# Patient Record
Sex: Female | Born: 1950 | Race: White | Hispanic: No | Marital: Married | Smoking: Current every day smoker
Health system: Southern US, Community
[De-identification: ages and names within clinical notes are randomized; demographics above are authoritative.]

## PROBLEM LIST (undated history)

## (undated) DIAGNOSIS — R609 Edema, unspecified: Secondary | ICD-10-CM

## (undated) DIAGNOSIS — I10 Essential (primary) hypertension: Secondary | ICD-10-CM

## (undated) DIAGNOSIS — R011 Cardiac murmur, unspecified: Secondary | ICD-10-CM

## (undated) DIAGNOSIS — E669 Obesity, unspecified: Secondary | ICD-10-CM

## (undated) DIAGNOSIS — E7800 Pure hypercholesterolemia, unspecified: Secondary | ICD-10-CM

## (undated) DIAGNOSIS — J449 Chronic obstructive pulmonary disease, unspecified: Secondary | ICD-10-CM

## (undated) DIAGNOSIS — K219 Gastro-esophageal reflux disease without esophagitis: Secondary | ICD-10-CM

## (undated) DIAGNOSIS — I6529 Occlusion and stenosis of unspecified carotid artery: Secondary | ICD-10-CM

## (undated) DIAGNOSIS — E569 Vitamin deficiency, unspecified: Secondary | ICD-10-CM

## (undated) DIAGNOSIS — E538 Deficiency of other specified B group vitamins: Secondary | ICD-10-CM

## (undated) DIAGNOSIS — G2581 Restless legs syndrome: Secondary | ICD-10-CM

## (undated) DIAGNOSIS — G4730 Sleep apnea, unspecified: Secondary | ICD-10-CM

## (undated) DIAGNOSIS — J309 Allergic rhinitis, unspecified: Secondary | ICD-10-CM

## (undated) DIAGNOSIS — I350 Nonrheumatic aortic (valve) stenosis: Secondary | ICD-10-CM

## (undated) DIAGNOSIS — J45909 Unspecified asthma, uncomplicated: Secondary | ICD-10-CM

## (undated) HISTORY — DX: Essential (primary) hypertension: I10

## (undated) HISTORY — DX: Obesity, unspecified: E66.9

## (undated) HISTORY — DX: Chronic obstructive pulmonary disease, unspecified: J44.9

## (undated) HISTORY — DX: Deficiency of other specified B group vitamins: E53.8

## (undated) HISTORY — DX: Edema, unspecified: R60.9

## (undated) HISTORY — DX: Cardiac murmur, unspecified: R01.1

## (undated) HISTORY — DX: Vitamin deficiency, unspecified: E56.9

## (undated) HISTORY — DX: Nonrheumatic aortic (valve) stenosis: I35.0

## (undated) HISTORY — DX: Allergic rhinitis, unspecified: J30.9

## (undated) HISTORY — DX: Restless legs syndrome: G25.81

## (undated) HISTORY — PX: TUBAL LIGATION: SHX77

## (undated) HISTORY — DX: Occlusion and stenosis of unspecified carotid artery: I65.29

---

## 1998-10-27 ENCOUNTER — Other Ambulatory Visit: Admission: RE | Admit: 1998-10-27 | Discharge: 1998-10-27 | Payer: Self-pay | Admitting: *Deleted

## 2000-11-03 ENCOUNTER — Other Ambulatory Visit: Admission: RE | Admit: 2000-11-03 | Discharge: 2000-11-03 | Payer: Self-pay | Admitting: *Deleted

## 2001-02-13 ENCOUNTER — Encounter (INDEPENDENT_AMBULATORY_CARE_PROVIDER_SITE_OTHER): Payer: Self-pay | Admitting: Specialist

## 2001-02-13 ENCOUNTER — Other Ambulatory Visit: Admission: RE | Admit: 2001-02-13 | Discharge: 2001-02-13 | Payer: Self-pay | Admitting: *Deleted

## 2005-06-18 ENCOUNTER — Ambulatory Visit (HOSPITAL_COMMUNITY): Admission: RE | Admit: 2005-06-18 | Discharge: 2005-06-18 | Payer: Self-pay | Admitting: *Deleted

## 2006-06-24 ENCOUNTER — Ambulatory Visit (HOSPITAL_COMMUNITY): Admission: RE | Admit: 2006-06-24 | Discharge: 2006-06-24 | Payer: Self-pay | Admitting: *Deleted

## 2006-07-02 ENCOUNTER — Encounter: Admission: RE | Admit: 2006-07-02 | Discharge: 2006-07-02 | Payer: Self-pay | Admitting: *Deleted

## 2006-08-08 ENCOUNTER — Emergency Department (HOSPITAL_COMMUNITY): Admission: EM | Admit: 2006-08-08 | Discharge: 2006-08-08 | Payer: Self-pay | Admitting: Emergency Medicine

## 2007-09-20 ENCOUNTER — Emergency Department (HOSPITAL_COMMUNITY): Admission: EM | Admit: 2007-09-20 | Discharge: 2007-09-20 | Payer: Self-pay | Admitting: Emergency Medicine

## 2007-11-10 ENCOUNTER — Encounter: Admission: RE | Admit: 2007-11-10 | Discharge: 2007-11-10 | Payer: Self-pay | Admitting: Family Medicine

## 2009-01-22 ENCOUNTER — Emergency Department (HOSPITAL_COMMUNITY): Admission: EM | Admit: 2009-01-22 | Discharge: 2009-01-22 | Payer: Self-pay | Admitting: Emergency Medicine

## 2010-06-13 ENCOUNTER — Other Ambulatory Visit: Admission: RE | Admit: 2010-06-13 | Discharge: 2010-06-13 | Payer: Self-pay | Admitting: Family Medicine

## 2010-11-11 ENCOUNTER — Encounter: Payer: Self-pay | Admitting: Family Medicine

## 2011-01-30 LAB — CBC
MCHC: 34.2 g/dL (ref 30.0–36.0)
MCV: 91.4 fL (ref 78.0–100.0)
Platelets: 134 10*3/uL — ABNORMAL LOW (ref 150–400)
RBC: 5.54 MIL/uL — ABNORMAL HIGH (ref 3.87–5.11)

## 2011-01-30 LAB — URINALYSIS, ROUTINE W REFLEX MICROSCOPIC
Glucose, UA: NEGATIVE mg/dL
Leukocytes, UA: NEGATIVE
Nitrite: NEGATIVE
Protein, ur: 30 mg/dL — AB
pH: 5.5 (ref 5.0–8.0)

## 2011-01-30 LAB — BASIC METABOLIC PANEL
BUN: 13 mg/dL (ref 6–23)
CO2: 26 mEq/L (ref 19–32)
Calcium: 9 mg/dL (ref 8.4–10.5)
Chloride: 109 mEq/L (ref 96–112)
Creatinine, Ser: 0.84 mg/dL (ref 0.4–1.2)
GFR calc Af Amer: >60 mL/min (ref >60)

## 2011-01-30 LAB — DIFFERENTIAL
Basophils Absolute: 0 10*3/uL (ref 0.0–0.1)
Basophils Relative: 0 % (ref 0–1)
Eosinophils Absolute: 0.1 10*3/uL (ref 0.0–0.7)
Lymphocytes Relative: 15 % (ref 12–46)
Monocytes Absolute: 0.5 10*3/uL (ref 0.1–1.0)
Neutrophils Relative %: 79 % — ABNORMAL HIGH (ref 43–77)

## 2011-01-30 LAB — URINE MICROSCOPIC-ADD ON

## 2011-06-06 ENCOUNTER — Other Ambulatory Visit: Payer: Self-pay | Admitting: Family Medicine

## 2011-07-10 ENCOUNTER — Other Ambulatory Visit: Payer: Self-pay

## 2012-10-13 ENCOUNTER — Emergency Department (HOSPITAL_COMMUNITY)
Admission: EM | Admit: 2012-10-13 | Discharge: 2012-10-13 | Disposition: A | Discharge status: Home / Self Care | Payer: 59 | Attending: Emergency Medicine | Admitting: Emergency Medicine

## 2012-10-13 ENCOUNTER — Encounter (HOSPITAL_COMMUNITY): Payer: Self-pay | Admitting: *Deleted

## 2012-10-13 ENCOUNTER — Emergency Department (HOSPITAL_COMMUNITY): Payer: 59

## 2012-10-13 DIAGNOSIS — S92901A Unspecified fracture of right foot, initial encounter for closed fracture: Secondary | ICD-10-CM

## 2012-10-13 DIAGNOSIS — J45909 Unspecified asthma, uncomplicated: Secondary | ICD-10-CM | POA: Insufficient documentation

## 2012-10-13 DIAGNOSIS — Y939 Activity, unspecified: Secondary | ICD-10-CM | POA: Insufficient documentation

## 2012-10-13 DIAGNOSIS — S92909A Unspecified fracture of unspecified foot, initial encounter for closed fracture: Secondary | ICD-10-CM | POA: Insufficient documentation

## 2012-10-13 DIAGNOSIS — R269 Unspecified abnormalities of gait and mobility: Secondary | ICD-10-CM | POA: Insufficient documentation

## 2012-10-13 DIAGNOSIS — Z79899 Other long term (current) drug therapy: Secondary | ICD-10-CM | POA: Insufficient documentation

## 2012-10-13 DIAGNOSIS — E7800 Pure hypercholesterolemia, unspecified: Secondary | ICD-10-CM | POA: Insufficient documentation

## 2012-10-13 DIAGNOSIS — X500XXA Overexertion from strenuous movement or load, initial encounter: Secondary | ICD-10-CM | POA: Insufficient documentation

## 2012-10-13 DIAGNOSIS — F17200 Nicotine dependence, unspecified, uncomplicated: Secondary | ICD-10-CM | POA: Insufficient documentation

## 2012-10-13 DIAGNOSIS — K219 Gastro-esophageal reflux disease without esophagitis: Secondary | ICD-10-CM | POA: Insufficient documentation

## 2012-10-13 DIAGNOSIS — Y929 Unspecified place or not applicable: Secondary | ICD-10-CM | POA: Insufficient documentation

## 2012-10-13 HISTORY — DX: Unspecified asthma, uncomplicated: J45.909

## 2012-10-13 HISTORY — DX: Pure hypercholesterolemia, unspecified: E78.00

## 2012-10-13 HISTORY — DX: Sleep apnea, unspecified: G47.30

## 2012-10-13 HISTORY — DX: Gastro-esophageal reflux disease without esophagitis: K21.9

## 2012-10-13 MED ORDER — HYDROCODONE-ACETAMINOPHEN 5-325 MG PO TABS: 2.0000 | ORAL_TABLET | ORAL | Status: DC | PRN

## 2012-10-13 NOTE — ED Notes (Signed)
Pt reports stepping off a curb approx 0900 this am. Sts she rolled her R ankle. C/o R foot paim specfically over 5th metatarsal. Saw PCP, office radiology closed, sent to ED for xray to r/o Fx.

## 2012-10-13 NOTE — ED Provider Notes (Signed)
Medical screening examination/treatment/procedure(s) were performed by non-physician practitioner and as supervising physician I was immediately available for consultation/collaboration.  Ethelda Chick, MD 10/13/12 1425

## 2012-10-13 NOTE — ED Provider Notes (Signed)
History     CSN: [id number]  Arrival date & time 10/13/12  1212   First MD Initiated Contact with Patient 10/13/12 1324      Chief Complaint  Patient presents with  . Foot Pain    (Consider location/radiation/quality/duration/timing/severity/associated sxs/prior treatment) HPI  61 year old female presents for evaluation of R foot and ankle injury.  Pt reports stepping off a curb this AM and rolled her R ankle.  She noticed sharp throbbing pain to her R foot, and having difficulty ambulating due to pain.  Pain is moderate in severity, radiates up to ankle, without numbness.  Denies significant ankle or knee pain.  Was seen at her doctor's office who gave her a post-op boot and sent pt to imaging center.  However imaging office is closed therefore pt comes to ER for xray. No hx of diabetes.  Does have hx of "leaking valve" and does take Lasix daily for her pedal edema.  Past Medical History  Diagnosis Date  . Asthma   . Hypercholesteremia   . GERD (gastroesophageal reflux disease)   . Sleep apnea     Past Surgical History  Procedure Date  . Tubal ligation     No family history on file.  History  Substance Use Topics  . Smoking status: Current Every Day Smoker  . Smokeless tobacco: Not on file  . Alcohol Use: No    OB History    Grav Para Term Preterm Abortions TAB SAB Ect Mult Living                  Review of Systems  Constitutional: Negative for fever.  Musculoskeletal: Positive for arthralgias and gait problem. Negative for joint swelling.  Skin: Negative for rash and wound.  Neurological: Negative for numbness.    Allergies  Shellfish allergy  Home Medications   Current Outpatient Rx  Name  Route  Sig  Dispense  Refill  . ALBUTEROL SULFATE HFA 108 (90 BASE) MCG/ACT IN AERS   Inhalation   Inhale 2 puffs into the lungs every 6 (six) hours as needed. wheezing         . BUDESONIDE-FORMOTEROL FUMARATE 160-4.5 MCG/ACT IN AERO   Inhalation   Inhale 2  puffs into the lungs 2 (two) times daily.         Marland Kitchen CETIRIZINE HCL 5 MG PO TABS   Oral   Take 5 mg by mouth daily.         Marland Kitchen VITAMIN D 2000 UNITS PO TABS   Oral   Take 2,000 Units by mouth daily.         Marland Kitchen VITAMIN B 12 PO   Oral   Take 1 tablet by mouth daily.         . FUROSEMIDE 20 MG PO TABS   Oral   Take 20-40 mg by mouth daily as needed. edema         . IBUPROFEN 200 MG PO TABS   Oral   Take 200 mg by mouth every 6 (six) hours as needed. pain         . OMEPRAZOLE 20 MG PO CPDR   Oral   Take 20 mg by mouth daily.         Marland Kitchen ROSUVASTATIN CALCIUM 20 MG PO TABS   Oral   Take 20 mg by mouth at bedtime.         Marland Kitchen VITAMIN E 400 UNITS PO CAPS   Oral   Take 400  Units by mouth daily.           BP 147/66  Pulse 78  Temp 98.4 F (36.9 C) (Oral)  Resp 16  SpO2 96%  Physical Exam  Nursing note and vitals reviewed. Constitutional: She is oriented to person, place, and time. She appears well-developed and well-nourished. No distress.       obese  HENT:  Head: Atraumatic.  Musculoskeletal: She exhibits edema (2+ pretibial pitting edema to BLE) and tenderness (R foot: tenderness to 5th metatarsal and dorsum of midfoot.  R ankle nontender on exam. pedal pulse palpable.  no ecchymosis).       Right knee: Normal.       Right ankle: She exhibits swelling. tenderness. Head of 5th metatarsal tenderness found. No lateral malleolus, no medial malleolus, no posterior TFL and no proximal fibula tenderness found.  Neurological: She is alert and oriented to person, place, and time.  Skin: Skin is warm. No rash noted.    ED Course  Procedures (including critical care time)  Dg Foot Complete Right  10/13/2012  *RADIOLOGY REPORT*  Clinical Data: Pain post trauma  RIGHT FOOT COMPLETE - 3+ VIEW  Comparison: None.  Findings: Frontal, oblique, and lateral views were obtained.  There is an obliquely oriented fracture through the proximal diaphysis of the fifth metatarsal  in near anatomic alignment.  No other fractures are identified.  No dislocation.  There are spurs arising from the posterior inferior calcaneus. Joint spaces appear intact.  IMPRESSION: Fracture of the proximal diaphysis of the fifth metatarsal. Alignment near anatomic.  No other fractures.  No dislocation.   Original Report Authenticated By: Bretta Bang, M.D.      1. R foot fracture, Yetta Barre  MDM  R foot pain after rolling her R ankle stepping off a curb.  No ankle pain on exam.  Xray of R foot.  Pt has post-op boot.    2:22 PM Xray reviewed by me shows a fracture of the proximal diaphysis of the fifth metatarsal.  Alignment near anatomic.  No other fracture.  Since pt has post-op boot, i recommend usage of it and referral to orthopedic.  Pain medication prescribed.  RICE therapy discussed.  Crutches given for support as needed.    BP 147/66  Pulse 78  Temp 98.4 F (36.9 C) (Oral)  Resp 16  SpO2 96%  I have reviewed nursing notes and vital signs. I personally reviewed the imaging tests through PACS system  I reviewed available ER/hospitalization records thought the EMR      Fayrene Helper, [place] 10/13/12 1424

## 2013-07-18 ENCOUNTER — Encounter: Payer: Self-pay | Admitting: *Deleted

## 2013-07-18 ENCOUNTER — Encounter: Payer: Self-pay | Admitting: Cardiology

## 2013-07-18 DIAGNOSIS — J449 Chronic obstructive pulmonary disease, unspecified: Secondary | ICD-10-CM | POA: Insufficient documentation

## 2013-07-18 DIAGNOSIS — E7800 Pure hypercholesterolemia, unspecified: Secondary | ICD-10-CM | POA: Insufficient documentation

## 2013-07-18 DIAGNOSIS — E569 Vitamin deficiency, unspecified: Secondary | ICD-10-CM | POA: Insufficient documentation

## 2013-07-18 DIAGNOSIS — J309 Allergic rhinitis, unspecified: Secondary | ICD-10-CM | POA: Insufficient documentation

## 2013-07-18 DIAGNOSIS — I350 Nonrheumatic aortic (valve) stenosis: Secondary | ICD-10-CM | POA: Insufficient documentation

## 2013-07-18 DIAGNOSIS — E669 Obesity, unspecified: Secondary | ICD-10-CM | POA: Insufficient documentation

## 2013-07-18 DIAGNOSIS — J45909 Unspecified asthma, uncomplicated: Secondary | ICD-10-CM | POA: Insufficient documentation

## 2013-07-18 DIAGNOSIS — G2581 Restless legs syndrome: Secondary | ICD-10-CM | POA: Insufficient documentation

## 2013-07-18 DIAGNOSIS — K219 Gastro-esophageal reflux disease without esophagitis: Secondary | ICD-10-CM | POA: Insufficient documentation

## 2013-07-18 DIAGNOSIS — I10 Essential (primary) hypertension: Secondary | ICD-10-CM | POA: Insufficient documentation

## 2013-07-18 DIAGNOSIS — R609 Edema, unspecified: Secondary | ICD-10-CM | POA: Insufficient documentation

## 2013-07-18 DIAGNOSIS — G4730 Sleep apnea, unspecified: Secondary | ICD-10-CM | POA: Insufficient documentation

## 2013-07-18 DIAGNOSIS — E538 Deficiency of other specified B group vitamins: Secondary | ICD-10-CM | POA: Insufficient documentation

## 2013-07-22 ENCOUNTER — Encounter: Payer: Self-pay | Admitting: Cardiology

## 2013-07-26 ENCOUNTER — Ambulatory Visit (INDEPENDENT_AMBULATORY_CARE_PROVIDER_SITE_OTHER): Payer: 59 | Admitting: Cardiology

## 2013-07-26 ENCOUNTER — Encounter: Payer: Self-pay | Admitting: Cardiology

## 2013-07-26 VITALS — BP 160/68 | HR 99 | Ht 65.0 in | Wt 269.0 lb

## 2013-07-26 DIAGNOSIS — I359 Nonrheumatic aortic valve disorder, unspecified: Secondary | ICD-10-CM

## 2013-07-26 DIAGNOSIS — F17200 Nicotine dependence, unspecified, uncomplicated: Secondary | ICD-10-CM

## 2013-07-26 DIAGNOSIS — I10 Essential (primary) hypertension: Secondary | ICD-10-CM

## 2013-07-26 DIAGNOSIS — G4730 Sleep apnea, unspecified: Secondary | ICD-10-CM

## 2013-07-26 DIAGNOSIS — I350 Nonrheumatic aortic (valve) stenosis: Secondary | ICD-10-CM

## 2013-07-26 DIAGNOSIS — E669 Obesity, unspecified: Secondary | ICD-10-CM

## 2013-07-26 DIAGNOSIS — Z7189 Other specified counseling: Secondary | ICD-10-CM

## 2013-07-26 DIAGNOSIS — Z716 Tobacco abuse counseling: Secondary | ICD-10-CM | POA: Insufficient documentation

## 2013-07-26 NOTE — Patient Instructions (Signed)
Your physician recommends that you continue on your current medications as directed. Please refer to the Current Medication list given to you today.   Your physician recommends that you schedule a follow-up appointment in: 6 months with Dr. Mayford Knife.  Your physician has requested that you have an echocardiogram. Echocardiography is a painless test that uses sound waves to create images of your heart. It provides your doctor with information about the size and shape of your heart and how well your heart's chambers and valves are working. This procedure takes approximately one hour. There are no restrictions for this procedure.  We have contacted [facility] to contact you regarding a download on you Bipap machine.

## 2013-07-26 NOTE — Progress Notes (Signed)
[address] [place], [place]  [address] Phone: [phone] Fax:  [phone]  Date:  07/26/2013   ID:  Tanya King, DOB 1950/12/25, MRN [id number]  PCP:  No primary provider on file.  Cardiologist:  Armanda Magic, MD  Sleep Medicine:  Armanda Magic, MD   History of Present Illness: Tanya King is a 62 y.o. female with a history of Obstructive sleep apnea, HTN, mild AS and obesity who presents today for follow up.  She is doing well.  She tolerates her CPAP well.  She has no problems with her full face mask except for occasional leakiness.  She tolerates her pressure setting well.  She feels refreshed in the am and has no daytime sleepiness.  She may take a nap during the day if she goes to bed too late.  She has started to walk for exercise.  She denies any chest pain, dizziness or syncope.  She has had some DOE which she thinks is from not exercising a lot.  She has chronic LE edema which is worse if she has been sitting for long periods of time.   Wt Readings from Last 3 Encounters:  07/26/13 269 lb (122.018 kg)     Past Medical History  Diagnosis Date  . Asthma   . Hypercholesteremia   . GERD (gastroesophageal reflux disease)   . Sleep apnea     AHI 116/hr now on BiPAP at 25/58mmHg  . Edema   . HTN (hypertension)   . Allergic rhinitis   . Vitamin B12 deficiency   . RLS (restless legs syndrome)   . Vitamin deficiency   . COPD (chronic obstructive pulmonary disease)   . Obesity   . Aortic stenosis     <30% B/L ICA stenosis  . Edema     chronic LE edema  . Heart murmur     (mild AV stenosis with no regurg, mild MV sclerosis with mild MR, mod LVH 2012)  . Carotid artery occlusion     <30% bilateral ICA stenosis, no flow in left vertebral artery    Current Outpatient Prescriptions  Medication Sig Dispense Refill  . albuterol (PROVENTIL HFA;VENTOLIN HFA) 108 (90 BASE) MCG/ACT inhaler Inhale 2 puffs into the lungs every 6 (six) hours as needed. wheezing        . cetirizine (ZYRTEC) 5 MG tablet Take 5 mg by mouth daily.      . Cholecalciferol (VITAMIN D) 2000 UNITS tablet Take 2,000 Units by mouth daily.      . Cyanocobalamin (VITAMIN B 12 PO) Take 1 tablet by mouth daily.      . Fluticasone-Salmeterol (ADVAIR DISKUS IN) Inhale 12 g into the lungs 2 (two) times daily.      . furosemide (LASIX) 20 MG tablet Take 20-40 mg by mouth daily as needed. edema      . ibuprofen (ADVIL,MOTRIN) 200 MG tablet Take 200 mg by mouth every 6 (six) hours as needed. pain      . omeprazole (PRILOSEC) 20 MG capsule Take 20 mg by mouth daily.      . rosuvastatin (CRESTOR) 20 MG tablet Take 20 mg by mouth at bedtime.      . vitamin E 400 UNIT capsule Take 400 Units by mouth daily.       No current facility-administered medications for this visit.    Allergies:    Allergies  Allergen Reactions  . Lisinopril     Feels like she is burning up  .  Shellfish Allergy Rash    Social History:  The patient  reports that she has been smoking.  She does not have any smokeless tobacco history on file. She reports that she does not drink alcohol or use illicit drugs.   Family History:  The patient's family history is not on file.   ROS:  Please see the history of present illness.      All other systems reviewed and negative.   PHYSICAL EXAM: VS:  Ht 5\' 5"  (1.651 m)  Wt 269 lb (122.018 kg)  BMI 44.76 kg/m2 Well nourished, well developed, in no acute distress HEENT: normal Neck: no JVD Cardiac:  normal S1, S2; RRR; 2/6 SM at RUSB with radiation to Right carotid artery Lungs:  clear to auscultation bilaterally, no wheezing, rhonchi or rales Abd: soft, nontender, no hepatomegaly Ext: chronic venous stasis changes with trace edema Skin: warm and dry Neuro:  CNs 2-12 intact, no focal abnormalities noted     ASSESSMENT AND PLAN:  1. Mild Aortic stenosis  - I will check an echo to reassess AS 2.  Obstructive sleep apnea tolerating CPAP  - I will get a CPAP d/l from her  DME 3.  HTN - her BP is mildly elevated today - she had [place] food last PM  - I have asked her to check her BP daily for a week and call with the results 4.  Obesity  - I have encouraged her to increase her exercise and watch her portions on her diet 5.  LE edema  - continue to take furosemide as needed for LE edem 6.  Tobacco abuse  - she has purchased e cigarettes and I have encouraged her to start them.  She does not want to try any medication cessation drugs.  Followup with me in 6 months  Signed, Armanda Magic, MD 07/26/2013 4:18 PM

## 2013-08-11 ENCOUNTER — Other Ambulatory Visit (HOSPITAL_COMMUNITY): Payer: 59

## 2013-08-11 ENCOUNTER — Ambulatory Visit (HOSPITAL_COMMUNITY): Payer: 59 | Attending: Cardiology

## 2013-08-11 DIAGNOSIS — I350 Nonrheumatic aortic (valve) stenosis: Secondary | ICD-10-CM

## 2013-08-11 DIAGNOSIS — J4489 Other specified chronic obstructive pulmonary disease: Secondary | ICD-10-CM | POA: Insufficient documentation

## 2013-08-11 DIAGNOSIS — I6529 Occlusion and stenosis of unspecified carotid artery: Secondary | ICD-10-CM | POA: Insufficient documentation

## 2013-08-11 DIAGNOSIS — G4730 Sleep apnea, unspecified: Secondary | ICD-10-CM | POA: Insufficient documentation

## 2013-08-11 DIAGNOSIS — I359 Nonrheumatic aortic valve disorder, unspecified: Secondary | ICD-10-CM

## 2013-08-11 DIAGNOSIS — J449 Chronic obstructive pulmonary disease, unspecified: Secondary | ICD-10-CM | POA: Insufficient documentation

## 2013-08-11 DIAGNOSIS — E669 Obesity, unspecified: Secondary | ICD-10-CM | POA: Insufficient documentation

## 2013-08-11 DIAGNOSIS — I10 Essential (primary) hypertension: Secondary | ICD-10-CM | POA: Insufficient documentation

## 2013-08-11 NOTE — Progress Notes (Signed)
Echocardiogram performed.  

## 2013-08-12 ENCOUNTER — Telehealth: Payer: Self-pay | Admitting: Cardiology

## 2013-08-12 NOTE — Telephone Encounter (Signed)
Spoke with pt to make aware of ECHO results.

## 2013-08-12 NOTE — Telephone Encounter (Signed)
follow up     Pt calling back.  Call her at home    Thanks!

## 2013-10-29 ENCOUNTER — Other Ambulatory Visit: Payer: Self-pay | Admitting: Family Medicine

## 2013-10-29 ENCOUNTER — Other Ambulatory Visit (HOSPITAL_COMMUNITY)
Admission: RE | Admit: 2013-10-29 | Discharge: 2013-10-29 | Disposition: A | Discharge status: Home / Self Care | Payer: 59 | Source: Ambulatory Visit | Attending: Family Medicine | Admitting: Family Medicine

## 2013-10-29 DIAGNOSIS — Z124 Encounter for screening for malignant neoplasm of cervix: Secondary | ICD-10-CM | POA: Insufficient documentation

## 2013-12-18 ENCOUNTER — Encounter: Payer: Self-pay | Admitting: *Deleted

## 2014-06-07 ENCOUNTER — Other Ambulatory Visit: Payer: Self-pay | Admitting: General Surgery

## 2014-06-07 DIAGNOSIS — G4733 Obstructive sleep apnea (adult) (pediatric): Secondary | ICD-10-CM

## 2014-09-07 ENCOUNTER — Other Ambulatory Visit: Payer: Self-pay | Admitting: Family Medicine

## 2014-09-07 ENCOUNTER — Other Ambulatory Visit (HOSPITAL_COMMUNITY): Payer: Self-pay | Admitting: Family Medicine

## 2014-09-07 DIAGNOSIS — I771 Stricture of artery: Secondary | ICD-10-CM

## 2014-09-08 ENCOUNTER — Other Ambulatory Visit (HOSPITAL_COMMUNITY): Payer: Self-pay | Admitting: Family Medicine

## 2014-09-08 ENCOUNTER — Ambulatory Visit (HOSPITAL_COMMUNITY)
Admission: RE | Admit: 2014-09-08 | Discharge: 2014-09-08 | Disposition: A | Discharge status: Home / Self Care | Payer: 59 | Source: Ambulatory Visit | Attending: Family Medicine | Admitting: Family Medicine

## 2014-09-08 DIAGNOSIS — I350 Nonrheumatic aortic (valve) stenosis: Secondary | ICD-10-CM

## 2014-09-08 DIAGNOSIS — I771 Stricture of artery: Secondary | ICD-10-CM

## 2014-09-08 DIAGNOSIS — E669 Obesity, unspecified: Secondary | ICD-10-CM

## 2014-09-08 DIAGNOSIS — I6523 Occlusion and stenosis of bilateral carotid arteries: Secondary | ICD-10-CM | POA: Insufficient documentation

## 2014-09-08 NOTE — Progress Notes (Addendum)
Bilateral carotid artery duplex completed:  1-39% ICA stenosis.  Right:  Vertebral artery flow is antegrade.  Left:  Vertebral artery flow is retrograde.  Left upper extremity arterial duplex completed:  No evidence of significant plaque or stenosis noted.  Abnormal Doppler waveforms noted throughout with lower blood pressures in the left arm.

## 2014-09-09 ENCOUNTER — Other Ambulatory Visit: Payer: Self-pay | Admitting: Family Medicine

## 2014-09-09 ENCOUNTER — Encounter: Payer: Self-pay | Admitting: Surgery

## 2014-09-09 ENCOUNTER — Ambulatory Visit
Admission: RE | Admit: 2014-09-09 | Discharge: 2014-09-09 | Disposition: A | Discharge status: Home / Self Care | Payer: 59 | Source: Ambulatory Visit | Attending: Family Medicine | Admitting: Family Medicine

## 2014-09-09 DIAGNOSIS — I771 Stricture of artery: Secondary | ICD-10-CM

## 2014-09-12 ENCOUNTER — Ambulatory Visit (INDEPENDENT_AMBULATORY_CARE_PROVIDER_SITE_OTHER): Payer: 59 | Admitting: Surgery

## 2014-09-12 ENCOUNTER — Encounter: Payer: Self-pay | Admitting: Surgery

## 2014-09-12 ENCOUNTER — Other Ambulatory Visit: Payer: Self-pay | Admitting: Family Medicine

## 2014-09-12 VITALS — BP 92/60 | HR 84 | Ht 65.0 in | Wt 276.8 lb

## 2014-09-12 DIAGNOSIS — R9389 Abnormal findings on diagnostic imaging of other specified body structures: Secondary | ICD-10-CM

## 2014-09-12 DIAGNOSIS — I771 Stricture of artery: Secondary | ICD-10-CM

## 2014-09-12 DIAGNOSIS — I708 Atherosclerosis of other arteries: Secondary | ICD-10-CM

## 2014-09-12 NOTE — Progress Notes (Signed)
[facility]   Referred by:  Lilian Coma, MD [address], [place] [address]  Reason for referral: subclavian artery stenosis  History of Present Illness  Tanya King is a 63 y.o. (11-04-1950) female who presents with chief complaint: subclavian artery stenosis found on workup after a discrepancy was found in the blood pressures between her arms. She reports her left arm blood pressure being 60 points lower than the right. She reports some numbness in her left middle finger that she feels is related to quilting. She denies any other numbness or weakness of her left extremity. She denies any pain in her left arm.  She denies any amaurosis fugax, hemiparesis, or expressive/receptive aphasia. She does not have a prior history of stroke.   She has hypertension managed on Azor. She has previously on a statin for hypercholesterolemia that was discontinued by her PCP a year ago. She takes a daily aspirin. She has no prior cardiac history. She is not diabetic. She is a current smoker. She has COPD and sleep apnea.     Past Medical History  Diagnosis Date  . Asthma   . Hypercholesteremia   . GERD (gastroesophageal reflux disease)   . Sleep apnea     AHI 116/hr now on BiPAP at 25/59mmHg  . Edema   . HTN (hypertension)   . Allergic rhinitis   . Vitamin B12 deficiency   . RLS (restless legs syndrome)   . Vitamin deficiency   . COPD (chronic obstructive pulmonary disease)   . Obesity   . Aortic stenosis   . Edema     chronic LE edema  . Heart murmur     (mild AV stenosis with no regurg, mild MV sclerosis with mild MR, mod LVH 2012)  . Carotid artery occlusion     <30% bilateral ICA stenosis, no flow in left vertebral artery    Past Surgical History  Procedure Laterality Date  . Tubal ligation      History   Social History  . Marital Status: Married    Spouse Name: N/A    Number of Children: N/A  . Years of Education:  N/A   Occupational History  . Not on file.   Social History Main Topics  . Smoking status: Current Every Day Smoker -- 1.00 packs/day for 42 years    Types: Cigarettes  . Smokeless tobacco: Not on file  . Alcohol Use: No  . Drug Use: No  . Sexual Activity: Not on file   Other Topics Concern  . Not on file   Social History Narrative    Family History  Problem Relation Age of Onset  . Parkinson's disease Father   . Lung cancer Brother   . Diabetes Brother   . Heart attack Brother   . Diabetes Mother   . Hyperlipidemia Mother     Current Outpatient Prescriptions on File Prior to Visit  Medication Sig Dispense Refill  . albuterol (PROVENTIL HFA;VENTOLIN HFA) 108 (90 BASE) MCG/ACT inhaler Inhale 2 puffs into the lungs every 6 (six) hours as needed. wheezing    . cetirizine (ZYRTEC) 5 MG tablet Take 5 mg by mouth daily.    . Cholecalciferol (VITAMIN D) 2000 UNITS tablet Take 2,000 Units by mouth daily.    . Cyanocobalamin (VITAMIN B 12 PO) Take 1 tablet by mouth daily.    . Fluticasone-Salmeterol (ADVAIR DISKUS IN) Inhale 12 g into the lungs 2 (two) times daily.    Marland Kitchen  furosemide (LASIX) 20 MG tablet Take 20-40 mg by mouth daily as needed. edema    . vitamin E 400 UNIT capsule Take 400 Units by mouth daily.    Marland Kitchen ibuprofen (ADVIL,MOTRIN) 200 MG tablet Take 200 mg by mouth every 6 (six) hours as needed. pain    . omeprazole (PRILOSEC) 20 MG capsule Take 20 mg by mouth daily.    . rosuvastatin (CRESTOR) 20 MG tablet Take 20 mg by mouth at bedtime.     No current facility-administered medications on file prior to visit.    Allergies  Allergen Reactions  . Lisinopril     Feels like she is burning up  . Shellfish Allergy Rash    REVIEW OF SYSTEMS:  (Positives checked otherwise negative)  CARDIOVASCULAR:  []  chest pain, []  chest pressure, []  palpitations, []  shortness of breath when laying flat, [x]  shortness of breath with exertion,  []  pain in feet when walking, []  pain in  feet when laying flat, []  history of blood clot in veins (DVT), []  history of phlebitis, [x]  swelling in legs, [x]  varicose veins  PULMONARY:  [x]  productive cough, [x]  asthma, [x]  wheezing  NEUROLOGIC:  []  weakness in arms or legs, []  numbness in arms or legs, []  difficulty speaking or slurred speech, []  temporary loss of vision in one eye, []  dizziness  HEMATOLOGIC:  []  bleeding problems, []  problems with blood clotting too easily  MUSCULOSKEL:  []  joint pain, []  joint swelling  GASTROINTEST:  []  vomiting blood, []  blood in stool     GENITOURINARY:  []  burning with urination, []  blood in urine  PSYCHIATRIC:  []  history of major depression  INTEGUMENTARY:  []  rashes, []  ulcers  CONSTITUTIONAL:  []  fever, []  chills  For VQI Use Only  PRE-ADM LIVING: Home  AMB STATUS: Ambulatory  CAD Sx: None  PRIOR CHF: None  STRESS TEST: [ x] No, [ ]  Normal, [ ]  + ischemia, [ ]  + MI, [ ]  Both   Physical Examination  Filed Vitals:   09/12/14 1452 09/12/14 1457  BP: 142/62 92/60  Pulse: 84   Height: 5\' 5"  (1.651 m)   Weight: 276 lb 12.8 oz (125.556 kg)   SpO2: 97%     Body mass index is 46.06 kg/(m^2).  General: A&O x 3, WD morbidly obese female in NAD  Head: [place]/AT  Eyes: PERRLA  Neck: Supple  Pulmonary: Sym exp, good air movt, CTAB, no rales, rhonchi, & wheezing   Cardiac: RRR, Nl S1, S2, systolic murmur at RSB, bruit over left pulmonic area  Vascular: Vessel Right Left  Radial Palpable Not Palpable  Brachial Palpable Not Palpable  Carotid Palpable, without bruit Palpable, without bruit  Aorta Not palpable N/A  PT Not Palpable Not Palpable  DP Not Palpable Not Palpable   Gastrointestinal: soft, obese, NTND, -G/R, - HSM, - masses  Musculoskeletal: M/S 5/5 throughout. Extremities without ischemic changes.   Neurologic: CN 2-12 intact. Pain and light touch intact in extremities. Motor exam as listed above  Psychiatric: Judgment intact, Mood & affect appropriate  for pt's clinical situation  Dermatologic: See M/S exam for extremity exam, no rashes otherwise noted  Non-Invasive Vascular Imaging  CAROTID DUPLEX (Date: 09/08/14):   R ICA stenosis: 1-39%  R VA:  patent and antegrade  L ICA stenosis: 1-39%  L VA:  Retrograde flow    UPPER EXTREMITY ARTERIAL DUPLEX (Date: 09/12/2014)  Right subclavian: biphasic; triphasic right brachial and radial  Left subclavian: monophasic   Left axillary: biphasic  Left brachial, radial and ulnar: monophasic  Outside Studies/Documentation 9 pages of outside documents were reviewed including: medical records from [facility].   Medical Decision Making  Winta Barcelo is a 63 y.o. female who presents with: asymptomatic left subclavian artery stenosis. Her carotid artery duplex revealed retrograde left vertebral flow and 1-39% internal carotid stenosis bilaterally. She denies any history of TIA or stroke symptoms and denies any left upper extremity weakness or pain. Given that she is asymptomatic from her left subclavian stenosis, there is no indication for surgical intervention.    Discussed in depth with the patient the importance of maximal medical management including strict control of blood pressure, lipid levels, antiplatelet agents, obtaining regular exercise, and cessation of smoking. Recommend restarting her on a cholesterol medication. She will follow up with her PCP regarding this.   The patient is aware that without maximal medical management the underlying atherosclerotic disease process will progress.   The patient will follow up in one year with carotid duplex studies.   She was advised to contact the office if she develops any ischemic symptoms of her left upper extremity.   Virgina Jock, PA-C [facility] Office: [phone] Pager: [phone]  09/12/2014, 4:11 PM   This patient was seen and examined in conjunction with Dr.  Trula Slade.   I agree with the above.  I have seen and examined the patient.  She has asymptomatic left subclavian steal syndrome.  This was first detected with asymmetric upper extremity blood pressure measurements.  She had a carotid Doppler study which showed no evidence of carotid stenosis, however she did have retrograde left vertebral artery blood flow.  This suggests proximal left subclavian artery stenosis versus occlusion.  The patient denies any neurologic symptoms.  She does not have weakness with activity in her left arm.  I discussed with the patient that as long as she remains asymptomatic, I would not recommend any intervention.  However, because she has developed a stenosis, I would treat her aggressively for peripheral vascular disease.  I discussed the following as important steps for her to do to improve her long-term outcome and minimize her risk for heart attack and stroke.  #1: She needs to continue to take a baby aspirin for antiplatelet therapy. #2: I would like for her to be placed back on a statin for hypercholesterolemia. #3: She needs to aggressively treat her blood pressure.  All medication changes should be based on readings from her right arm.   #4: I discussed extensively with her the importance of smoking cessation.  #5: I encouraged her to start an exercise program.  The patient will follow-up with me with a repeat carotid/subclavian ultrasound in one year  If she develops symptoms, she will contact me sooner than that.  Annamarie Major

## 2014-09-14 ENCOUNTER — Ambulatory Visit
Admission: RE | Admit: 2014-09-14 | Discharge: 2014-09-14 | Disposition: A | Discharge status: Home / Self Care | Payer: 59 | Source: Ambulatory Visit | Attending: Family Medicine | Admitting: Family Medicine

## 2014-09-14 DIAGNOSIS — R9389 Abnormal findings on diagnostic imaging of other specified body structures: Secondary | ICD-10-CM

## 2014-09-14 MED ORDER — IOHEXOL 300 MG/ML  SOLN
75.0000 mL | Freq: Once | INTRAMUSCULAR | Status: AC | PRN
  Administered 2014-09-14: 75 mL via INTRAVENOUS

## 2014-09-14 NOTE — Addendum Note (Signed)
Addended by: Dorthula Rue L on: 09/14/2014 12:53 PM   Modules accepted: Orders

## 2014-09-14 NOTE — Addendum Note (Signed)
Addended by: Dorthula Rue L on: 09/14/2014 09:06 AM   Modules accepted: Orders

## 2014-09-19 ENCOUNTER — Other Ambulatory Visit (HOSPITAL_COMMUNITY): Payer: Self-pay | Admitting: Surgery

## 2014-09-19 ENCOUNTER — Other Ambulatory Visit (HOSPITAL_COMMUNITY): Payer: Self-pay | Admitting: Family Medicine

## 2014-09-19 DIAGNOSIS — J9859 Other diseases of mediastinum, not elsewhere classified: Secondary | ICD-10-CM

## 2014-09-19 DIAGNOSIS — R918 Other nonspecific abnormal finding of lung field: Secondary | ICD-10-CM

## 2014-09-21 ENCOUNTER — Encounter (HOSPITAL_COMMUNITY): Payer: Self-pay

## 2014-09-21 ENCOUNTER — Ambulatory Visit (HOSPITAL_COMMUNITY)
Admission: RE | Admit: 2014-09-21 | Discharge: 2014-09-21 | Disposition: A | Discharge status: Home / Self Care | Payer: 59 | Source: Ambulatory Visit | Attending: Surgery | Admitting: Surgery

## 2014-09-21 DIAGNOSIS — J9859 Other diseases of mediastinum, not elsewhere classified: Secondary | ICD-10-CM

## 2014-09-21 DIAGNOSIS — R918 Other nonspecific abnormal finding of lung field: Secondary | ICD-10-CM | POA: Diagnosis not present

## 2014-09-21 LAB — GLUCOSE, CAPILLARY: GLUCOSE-CAPILLARY: 102 mg/dL — AB (ref 70–99)

## 2014-09-21 MED ORDER — FLUDEOXYGLUCOSE F - 18 (FDG) INJECTION
13.9000 | Freq: Once | INTRAVENOUS | Status: AC | PRN
  Administered 2014-09-21: 13.9 via INTRAVENOUS

## 2014-12-07 ENCOUNTER — Inpatient Hospital Stay (HOSPITAL_COMMUNITY)
Admission: EM | Admit: 2014-12-07 | Discharge: 2014-12-10 | DRG: 070 | Disposition: A | Discharge status: Home / Self Care | Payer: 59 | Attending: Family Medicine | Admitting: Family Medicine

## 2014-12-07 ENCOUNTER — Encounter (HOSPITAL_COMMUNITY): Payer: Self-pay | Admitting: *Deleted

## 2014-12-07 ENCOUNTER — Emergency Department (HOSPITAL_COMMUNITY): Payer: 59

## 2014-12-07 DIAGNOSIS — Z7982 Long term (current) use of aspirin: Secondary | ICD-10-CM

## 2014-12-07 DIAGNOSIS — I6523 Occlusion and stenosis of bilateral carotid arteries: Secondary | ICD-10-CM | POA: Diagnosis present

## 2014-12-07 DIAGNOSIS — B9620 Unspecified Escherichia coli [E. coli] as the cause of diseases classified elsewhere: Secondary | ICD-10-CM | POA: Diagnosis present

## 2014-12-07 DIAGNOSIS — Z91013 Allergy to seafood: Secondary | ICD-10-CM | POA: Diagnosis not present

## 2014-12-07 DIAGNOSIS — Z801 Family history of malignant neoplasm of trachea, bronchus and lung: Secondary | ICD-10-CM | POA: Diagnosis not present

## 2014-12-07 DIAGNOSIS — J45909 Unspecified asthma, uncomplicated: Secondary | ICD-10-CM | POA: Diagnosis present

## 2014-12-07 DIAGNOSIS — E785 Hyperlipidemia, unspecified: Secondary | ICD-10-CM | POA: Diagnosis present

## 2014-12-07 DIAGNOSIS — G2581 Restless legs syndrome: Secondary | ICD-10-CM | POA: Diagnosis present

## 2014-12-07 DIAGNOSIS — G4733 Obstructive sleep apnea (adult) (pediatric): Secondary | ICD-10-CM | POA: Diagnosis present

## 2014-12-07 DIAGNOSIS — R011 Cardiac murmur, unspecified: Secondary | ICD-10-CM | POA: Diagnosis present

## 2014-12-07 DIAGNOSIS — G936 Cerebral edema: Secondary | ICD-10-CM | POA: Diagnosis present

## 2014-12-07 DIAGNOSIS — I350 Nonrheumatic aortic (valve) stenosis: Secondary | ICD-10-CM | POA: Diagnosis present

## 2014-12-07 DIAGNOSIS — R262 Difficulty in walking, not elsewhere classified: Secondary | ICD-10-CM | POA: Diagnosis present

## 2014-12-07 DIAGNOSIS — Z8249 Family history of ischemic heart disease and other diseases of the circulatory system: Secondary | ICD-10-CM | POA: Diagnosis not present

## 2014-12-07 DIAGNOSIS — R599 Enlarged lymph nodes, unspecified: Secondary | ICD-10-CM | POA: Diagnosis present

## 2014-12-07 DIAGNOSIS — I10 Essential (primary) hypertension: Secondary | ICD-10-CM | POA: Diagnosis present

## 2014-12-07 DIAGNOSIS — E538 Deficiency of other specified B group vitamins: Secondary | ICD-10-CM | POA: Diagnosis present

## 2014-12-07 DIAGNOSIS — N390 Urinary tract infection, site not specified: Secondary | ICD-10-CM | POA: Diagnosis present

## 2014-12-07 DIAGNOSIS — R911 Solitary pulmonary nodule: Secondary | ICD-10-CM | POA: Diagnosis present

## 2014-12-07 DIAGNOSIS — K219 Gastro-esophageal reflux disease without esophagitis: Secondary | ICD-10-CM | POA: Diagnosis present

## 2014-12-07 DIAGNOSIS — E669 Obesity, unspecified: Secondary | ICD-10-CM | POA: Diagnosis present

## 2014-12-07 DIAGNOSIS — R4781 Slurred speech: Secondary | ICD-10-CM | POA: Diagnosis present

## 2014-12-07 DIAGNOSIS — Z6841 Body Mass Index (BMI) 40.0 and over, adult: Secondary | ICD-10-CM | POA: Diagnosis not present

## 2014-12-07 DIAGNOSIS — Z888 Allergy status to other drugs, medicaments and biological substances status: Secondary | ICD-10-CM

## 2014-12-07 DIAGNOSIS — G939 Disorder of brain, unspecified: Principal | ICD-10-CM | POA: Diagnosis present

## 2014-12-07 DIAGNOSIS — Z79899 Other long term (current) drug therapy: Secondary | ICD-10-CM | POA: Diagnosis not present

## 2014-12-07 DIAGNOSIS — Z833 Family history of diabetes mellitus: Secondary | ICD-10-CM | POA: Diagnosis not present

## 2014-12-07 DIAGNOSIS — E780 Pure hypercholesterolemia: Secondary | ICD-10-CM | POA: Diagnosis present

## 2014-12-07 DIAGNOSIS — J449 Chronic obstructive pulmonary disease, unspecified: Secondary | ICD-10-CM | POA: Diagnosis present

## 2014-12-07 DIAGNOSIS — R52 Pain, unspecified: Secondary | ICD-10-CM

## 2014-12-07 DIAGNOSIS — G8191 Hemiplegia, unspecified affecting right dominant side: Secondary | ICD-10-CM | POA: Diagnosis present

## 2014-12-07 DIAGNOSIS — R531 Weakness: Secondary | ICD-10-CM | POA: Diagnosis not present

## 2014-12-07 DIAGNOSIS — F17210 Nicotine dependence, cigarettes, uncomplicated: Secondary | ICD-10-CM | POA: Diagnosis present

## 2014-12-07 DIAGNOSIS — G9389 Other specified disorders of brain: Secondary | ICD-10-CM

## 2014-12-07 DIAGNOSIS — Z820 Family history of epilepsy and other diseases of the nervous system: Secondary | ICD-10-CM

## 2014-12-07 DIAGNOSIS — R918 Other nonspecific abnormal finding of lung field: Secondary | ICD-10-CM

## 2014-12-07 LAB — COMPREHENSIVE METABOLIC PANEL
ALT: 20 U/L (ref 0–35)
AST: 24 U/L (ref 0–37)
Albumin: 3.8 g/dL (ref 3.5–5.2)
Alkaline Phosphatase: 56 U/L (ref 39–117)
Anion gap: 9 (ref 5–15)
BILIRUBIN TOTAL: 0.7 mg/dL (ref 0.3–1.2)
BUN: 11 mg/dL (ref 6–23)
CO2: 28 mmol/L (ref 19–32)
CREATININE: 0.87 mg/dL (ref 0.50–1.10)
Calcium: 9.4 mg/dL (ref 8.4–10.5)
Chloride: 102 mmol/L (ref 96–112)
GFR calc non Af Amer: 70 mL/min — ABNORMAL LOW (ref >90)
GFR, EST AFRICAN AMERICAN: 82 mL/min — AB (ref >90)
GLUCOSE: 93 mg/dL (ref 70–99)
POTASSIUM: 3.8 mmol/L (ref 3.5–5.1)
Sodium: 139 mmol/L (ref 135–145)
Total Protein: 7.3 g/dL (ref 6.0–8.3)

## 2014-12-07 LAB — CBC
HEMATOCRIT: 46.6 % — AB (ref 36.0–46.0)
HEMOGLOBIN: 16 g/dL — AB (ref 12.0–15.0)
MCH: 31.6 pg (ref 26.0–34.0)
MCHC: 34.3 g/dL (ref 30.0–36.0)
MCV: 91.9 fL (ref 78.0–100.0)
Platelets: 157 10*3/uL (ref 150–400)
RBC: 5.07 MIL/uL (ref 3.87–5.11)
RDW: 13.9 % (ref 11.5–15.5)
WBC: 11.9 10*3/uL — ABNORMAL HIGH (ref 4.0–10.5)

## 2014-12-07 LAB — URINALYSIS, ROUTINE W REFLEX MICROSCOPIC
Glucose, UA: NEGATIVE mg/dL
Hgb urine dipstick: NEGATIVE
KETONES UR: NEGATIVE mg/dL
NITRITE: POSITIVE — AB
PH: 5.5 (ref 5.0–8.0)
PROTEIN: NEGATIVE mg/dL
Specific Gravity, Urine: 1.024 (ref 1.005–1.030)
Urobilinogen, UA: 1 mg/dL (ref 0.0–1.0)

## 2014-12-07 LAB — URINE MICROSCOPIC-ADD ON

## 2014-12-07 LAB — TROPONIN I

## 2014-12-07 LAB — CK: Total CK: 252 U/L — ABNORMAL HIGH (ref 7–177)

## 2014-12-07 MED ORDER — DEXAMETHASONE SODIUM PHOSPHATE 4 MG/ML IJ SOLN
8.0000 mg | Freq: Once | INTRAMUSCULAR | Status: AC
Start: 2014-12-07 — End: 2014-12-07
  Administered 2014-12-07: 8 mg via INTRAVENOUS
  Filled 2014-12-07: qty 2

## 2014-12-07 MED ORDER — DEXTROSE 5 % IV SOLN
1.0000 g | Freq: Once | INTRAVENOUS | Status: AC
  Administered 2014-12-07: 1 g via INTRAVENOUS
  Filled 2014-12-07: qty 10

## 2014-12-07 MED ORDER — SENNOSIDES-DOCUSATE SODIUM 8.6-50 MG PO TABS: 1.0000 | ORAL_TABLET | Freq: Every evening | ORAL | Status: DC | PRN

## 2014-12-07 MED ORDER — VITAMIN D 1000 UNITS PO TABS
2000.0000 [IU] | ORAL_TABLET | Freq: Every day | ORAL | Status: DC
  Administered 2014-12-09: 2000 [IU] via ORAL
  Filled 2014-12-07 (×3): qty 2

## 2014-12-07 MED ORDER — ASPIRIN 325 MG PO TABS
325.0000 mg | ORAL_TABLET | Freq: Every day | ORAL | Status: DC
  Filled 2014-12-07 (×2): qty 1

## 2014-12-07 MED ORDER — PANTOPRAZOLE SODIUM 40 MG IV SOLR
40.0000 mg | Freq: Every day | INTRAVENOUS | Status: DC
  Administered 2014-12-08 (×2): 40 mg via INTRAVENOUS
  Filled 2014-12-07 (×3): qty 40

## 2014-12-07 MED ORDER — SODIUM CHLORIDE 0.9 % IV SOLN
INTRAVENOUS | Status: DC
  Administered 2014-12-07: 20:00:00 via INTRAVENOUS

## 2014-12-07 MED ORDER — ALBUTEROL SULFATE HFA 108 (90 BASE) MCG/ACT IN AERS
2.0000 | INHALATION_SPRAY | Freq: Four times a day (QID) | RESPIRATORY_TRACT | Status: DC | PRN
Start: 2014-12-07 — End: 2014-12-08

## 2014-12-07 MED ORDER — ROSUVASTATIN CALCIUM 10 MG PO TABS
10.0000 mg | ORAL_TABLET | Freq: Every evening | ORAL | Status: DC
  Administered 2014-12-09: 10 mg via ORAL
  Filled 2014-12-07 (×3): qty 1

## 2014-12-07 MED ORDER — SODIUM CHLORIDE 0.9 % IV SOLN: INTRAVENOUS | Status: AC

## 2014-12-07 MED ORDER — DEXAMETHASONE SODIUM PHOSPHATE 4 MG/ML IJ SOLN
4.0000 mg | Freq: Four times a day (QID) | INTRAMUSCULAR | Status: DC
  Administered 2014-12-08 – 2014-12-09 (×5): 4 mg via INTRAVENOUS
  Filled 2014-12-07 (×10): qty 1

## 2014-12-07 MED ORDER — MOMETASONE FURO-FORMOTEROL FUM 100-5 MCG/ACT IN AERO
2.0000 | INHALATION_SPRAY | Freq: Two times a day (BID) | RESPIRATORY_TRACT | Status: DC
  Administered 2014-12-09 – 2014-12-10 (×3): 2 via RESPIRATORY_TRACT
  Filled 2014-12-07: qty 8.8

## 2014-12-07 MED ORDER — ENOXAPARIN SODIUM 40 MG/0.4ML ~~LOC~~ SOLN
40.0000 mg | Freq: Every day | SUBCUTANEOUS | Status: DC
  Administered 2014-12-08 – 2014-12-09 (×3): 40 mg via SUBCUTANEOUS
  Filled 2014-12-07 (×4): qty 0.4

## 2014-12-07 MED ORDER — LOSARTAN POTASSIUM-HCTZ 100-12.5 MG PO TABS: 1.0000 | ORAL_TABLET | Freq: Every day | ORAL | Status: DC

## 2014-12-07 NOTE — ED Notes (Signed)
Patient transported to CT 

## 2014-12-07 NOTE — ED Notes (Addendum)
Pt's family reports possible Stroke.  Has been having AMS, shuffling gait, "thick tongued", and "out of it" for the last 2-3 weeks.  Was instructed by PCP to take pt to the ED to r/o CVA.  Pt denies any pain at this time.    Pt is A&O x 4.

## 2014-12-07 NOTE — ED Provider Notes (Signed)
CSN: [id number]     Arrival date & time 12/07/14  1740 History   First MD Initiated Contact with Patient 12/07/14 1940     Chief Complaint  Patient presents with  . Altered Mental Status     (Consider location/radiation/quality/duration/timing/severity/associated sxs/prior Treatment) Patient is a 64 y.o. female presenting with altered mental status. The history is provided by the patient and the spouse.  Altered Mental Status Presenting symptoms: confusion   Associated symptoms: weakness   Associated symptoms: no abdominal pain, no fever, no headaches, no rash and no vomiting   pt noted by spouse to have multiple new symptoms in the course of past 3 weeks. States she has right sided weakness esp right hand, whereby she seems to be dropping many things.  Also has noted slowness to respond to questions, hasnt recognized familiar places/activities, speech has seemed slow or 'thick tongued', and gait has been slower than normal and shuffling.  Pt is aware of right arm/hand weakness, but has little insight into other symptoms. Is right hand dominant. Denies any recent trauma or fall. No syncope. Pt denies pain. No headache. No cp or sob. No abd pain or nv. Normal appetite, good po intake. No recent wt change. No dysuria or gu c/o. No fever or chills. No recent change x crestor added last month.       Past Medical History  Diagnosis Date  . Asthma   . Hypercholesteremia   . GERD (gastroesophageal reflux disease)   . Sleep apnea     AHI 116/hr now on BiPAP at 25/16mmHg  . Edema   . HTN (hypertension)   . Allergic rhinitis   . Vitamin B12 deficiency   . RLS (restless legs syndrome)   . Vitamin deficiency   . COPD (chronic obstructive pulmonary disease)   . Obesity   . Aortic stenosis   . Edema     chronic LE edema  . Heart murmur     (mild AV stenosis with no regurg, mild MV sclerosis with mild MR, mod LVH 2012)  . Carotid artery occlusion     <30% bilateral ICA stenosis, no flow in  left vertebral artery   Past Surgical History  Procedure Laterality Date  . Tubal ligation     Family History  Problem Relation Age of Onset  . Parkinson's disease Father   . Lung cancer Brother   . Diabetes Brother   . Heart attack Brother   . Diabetes Mother   . Hyperlipidemia Mother    History  Substance Use Topics  . Smoking status: Current Every Day Smoker -- 1.00 packs/day for 42 years    Types: Cigarettes  . Smokeless tobacco: Not on file  . Alcohol Use: No   OB History    No data available     Review of Systems  Constitutional: Negative for fever and chills.  HENT: Negative for sore throat.   Eyes: Negative for visual disturbance.  Respiratory: Negative for shortness of breath.   Cardiovascular: Negative for chest pain and leg swelling.  Gastrointestinal: Negative for vomiting, abdominal pain and diarrhea.  Endocrine: Negative for polyuria.  Genitourinary: Negative for dysuria and flank pain.  Musculoskeletal: Negative for back pain, neck pain and neck stiffness.  Skin: Negative for rash.  Neurological: Positive for weakness. Negative for numbness and headaches.  Hematological: Does not bruise/bleed easily.  Psychiatric/Behavioral: Positive for confusion.      Allergies  Lisinopril and Shellfish allergy  Home Medications   Prior to Admission medications  Medication Sig Start Date End Date Taking? Authorizing Provider  albuterol (PROVENTIL HFA;VENTOLIN HFA) 108 (90 BASE) MCG/ACT inhaler Inhale 2 puffs into the lungs every 6 (six) hours as needed. wheezing    Historical Provider, MD  cetirizine (ZYRTEC) 5 MG tablet Take 5 mg by mouth daily.    Historical Provider, MD  Cholecalciferol (VITAMIN D) 2000 UNITS tablet Take 2,000 Units by mouth daily.    Historical Provider, MD  Cyanocobalamin (VITAMIN B 12 PO) Take 1 tablet by mouth daily.    Historical Provider, MD  Fluticasone-Salmeterol (ADVAIR DISKUS IN) Inhale 12 g into the lungs 2 (two) times daily.     Historical Provider, MD  furosemide (LASIX) 20 MG tablet Take 20-40 mg by mouth daily as needed. edema    Historical Provider, MD  ibuprofen (ADVIL,MOTRIN) 200 MG tablet Take 200 mg by mouth every 6 (six) hours as needed. pain    Historical Provider, MD  omeprazole (PRILOSEC) 20 MG capsule Take 20 mg by mouth daily.    Historical Provider, MD  rosuvastatin (CRESTOR) 20 MG tablet Take 20 mg by mouth at bedtime.    Historical Provider, MD  vitamin E 400 UNIT capsule Take 400 Units by mouth daily.    Historical Provider, MD   BP 125/52 mmHg  Pulse 96  Temp(Src) 99.2 F (37.3 C) (Oral)  Resp 20  SpO2 91% Physical Exam  Constitutional: She is oriented to person, place, and time. She appears well-developed and well-nourished. No distress.  HENT:  Head: Atraumatic.  Mouth/Throat: Oropharynx is clear and moist.  Eyes: Conjunctivae and EOM are normal. Pupils are equal, round, and reactive to light. No scleral icterus.  Neck: Normal range of motion. Neck supple. No tracheal deviation present. No thyromegaly present.  No bruit  Cardiovascular: Normal rate, regular rhythm, normal heart sounds and intact distal pulses.  Exam reveals no gallop and no friction rub.   No murmur heard. Pulmonary/Chest: Effort normal and breath sounds normal. No respiratory distress.  Abdominal: Normal appearance and bowel sounds are normal. She exhibits no distension and no mass. There is no tenderness. There is no rebound and no guarding.  Genitourinary:  No cva tenderness  Musculoskeletal: She exhibits no edema or tenderness.  Neurological: She is alert and oriented to person, place, and time. No cranial nerve deficit.  Responds to questions sl slow, but fluently, and without apparent gross aphasia. RUE weakness 4/5. LUE 5/5, bil lower ext 5/5. Ambulates w sl slow gait, w mild shuffling quality.   Skin: Skin is warm and dry. No rash noted. She is not diaphoretic.  Psychiatric: She has a normal mood and affect.   Nursing note and vitals reviewed.   ED Course  Procedures (including critical care time) Labs Review  Results for orders placed or performed during the hospital encounter of 12/07/14  CBC  Result Value Ref Range   WBC 11.9 (H) 4.0 - 10.5 K/uL   RBC 5.07 3.87 - 5.11 MIL/uL   Hemoglobin 16.0 (H) 12.0 - 15.0 g/dL   HCT 46.6 (H) 36.0 - 46.0 %   MCV 91.9 78.0 - 100.0 fL   MCH 31.6 26.0 - 34.0 pg   MCHC 34.3 30.0 - 36.0 g/dL   RDW 13.9 11.5 - 15.5 %   Platelets 157 150 - 400 K/uL  Comprehensive metabolic panel  Result Value Ref Range   Sodium 139 135 - 145 mmol/L   Potassium 3.8 3.5 - 5.1 mmol/L   Chloride 102 96 - 112 mmol/L  CO2 28 19 - 32 mmol/L   Glucose, Bld 93 70 - 99 mg/dL   BUN 11 6 - 23 mg/dL   Creatinine, Ser 0.87 0.50 - 1.10 mg/dL   Calcium 9.4 8.4 - 10.5 mg/dL   Total Protein 7.3 6.0 - 8.3 g/dL   Albumin 3.8 3.5 - 5.2 g/dL   AST 24 0 - 37 U/L   ALT 20 0 - 35 U/L   Alkaline Phosphatase 56 39 - 117 U/L   Total Bilirubin 0.7 0.3 - 1.2 mg/dL   GFR calc non Af Amer 70 (L) >90 mL/min   GFR calc Af Amer 82 (L) >90 mL/min   Anion gap 9 5 - 15  Urinalysis, Routine w reflex microscopic  Result Value Ref Range   Color, Urine YELLOW YELLOW   APPearance CLOUDY (A) CLEAR   Specific Gravity, Urine 1.024 1.005 - 1.030   pH 5.5 5.0 - 8.0   Glucose, UA NEGATIVE NEGATIVE mg/dL   Hgb urine dipstick NEGATIVE NEGATIVE   Bilirubin Urine SMALL (A) NEGATIVE   Ketones, ur NEGATIVE NEGATIVE mg/dL   Protein, ur NEGATIVE NEGATIVE mg/dL   Urobilinogen, UA 1.0 0.0 - 1.0 mg/dL   Nitrite POSITIVE (A) NEGATIVE   Leukocytes, UA MODERATE (A) NEGATIVE  Troponin I  Result Value Ref Range   Troponin I <0.03 <0.031 ng/mL  CK  Result Value Ref Range   Total CK 252 (H) 7 - 177 U/L  Urine microscopic-add on  Result Value Ref Range   Squamous Epithelial / LPF FEW (A) RARE   WBC, UA 21-50 <3 WBC/hpf   Bacteria, UA MANY (A) RARE   Crystals CA OXALATE CRYSTALS (A) NEGATIVE   Ct Head Wo  Contrast  12/07/2014   CLINICAL DATA:  Progressive weakness and slurred speech.  EXAM: CT HEAD WITHOUT CONTRAST  TECHNIQUE: Contiguous axial images were obtained from the base of the skull through the vertex without intravenous contrast.  COMPARISON:  None.  FINDINGS: A single mass lesion versus two contiguous lesions is identified in the left cerebral hemisphere centered in the basal ganglia. Assuming one lesion, it measures 2.8 x 4.3 cm. There is surrounding vasogenic edema. Mass effect is present on the anterior horn of the left lateral ventricle and mild left-to-right shift of 0.5 cm. No other mass is identified. There is no hemorrhage, acute infarction or abnormal extra-axial fluid collection. The calvarium is intact.  IMPRESSION: The study is positive for a single large mass versus two contiguous masses centered in the left basal ganglia. MRI of the brain with and without contrast is recommended for further evaluation.   Electronically Signed   By: Inge Rise M.D.   On: 12/07/2014 21:43       EKG Interpretation   Date/Time:  Wednesday December 07 2014 19:54:26 EST Ventricular Rate:  69 PR Interval:  151 QRS Duration: 96 QT Interval:  401 QTC Calculation: 430 R Axis:   55 Text Interpretation:  Sinus rhythm Atrial premature complex No previous  tracing Confirmed by Ashok Cordia  MD, Lennette Bihari ([id number]) on 12/07/2014 7:59:18 PM      MDM   Iv ns. Continuous pulse ox and monitor. Ecg. Labs.  Reviewed nursing notes and prior charts for additional history.   Discussed pt, ct with neurosurgery, Dr Arnoldo Morale.  He reviewed cts. 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  Recommends admit to med service, onc consultation, workup looking for primary, decadron, mri in AM. No anticonv for now. He indicates if no primary found, and eventual  need for bx, that could be done subsequently.   Decadron iv.  hospitalists paged for admission.   uti on labs. u cx.  Rocephin iv.   Discussed ct w pt.      Mirna Mires, MD 12/07/14 2207

## 2014-12-07 NOTE — ED Notes (Signed)
Nurse starting IV 

## 2014-12-07 NOTE — ED Notes (Signed)
EKG given to EDP,Steinl,MD., for review. Pt. On cardiac monitor.

## 2014-12-07 NOTE — ED Notes (Signed)
Pts family states pt was dropping down to 87-88% while sleeping, pt requesting for pt to be put on oxygen.

## 2014-12-07 NOTE — ED Notes (Signed)
Pt and family states for the past 2 1/2-3 weeks she's had R hand grip weakness, states has trouble gripping a pen or a knife for example, pt also has been shuffling her gate. Pt able to walk in room w/o help or difficulty, pt talking w/o problems, her husband states it feels like she has a big tongue. Pt a/o x 3.

## 2014-12-07 NOTE — H&P (Signed)
[facility] History and Physical  Tanya King CXK:[id number] DOB: 13-Nov-1950 DOA: 12/07/2014  Referring physician: ER physician. PCP: Lilian Coma, MD  Chief Complaint: Difficulty balancing and right-sided weakness.  HPI: Tanya King is a 64 y.o. female with known history of hypertension, hyperlipidemia, probable COPD, tobacco abuse presents to the ER because of increasing difficulty walking due to balance issues and also patient's family found the patient has been having some weakness on the right and lower extremities. Patient also was found to have some slurred speech. Patient's symptoms have been ongoing for last 2-3 weeks. Patient did not have any loss of consciousness blurred vision headache incontinence of urine or bowel. In the ER patient had a CT head which shows brain mass. On-call neurosurgeon was consulted and at this time neurosurgeon has advised to admit patient and get workup for possible primary. Patient otherwise denies any chest pain shortness of breath nausea vomiting abdominal pain diarrhea fever chills. On exam patient has mild weakness on the right and lower extremities. PERRLA positive.   Review of Systems: As presented in the history of presenting illness, rest negative.  Past Medical History  Diagnosis Date  . Asthma   . Hypercholesteremia   . GERD (gastroesophageal reflux disease)   . Sleep apnea     AHI 116/hr now on BiPAP at 25/33mmHg  . Edema   . HTN (hypertension)   . Allergic rhinitis   . Vitamin B12 deficiency   . RLS (restless legs syndrome)   . Vitamin deficiency   . COPD (chronic obstructive pulmonary disease)   . Obesity   . Aortic stenosis   . Edema     chronic LE edema  . Heart murmur     (mild AV stenosis with no regurg, mild MV sclerosis with mild MR, mod LVH 2012)  . Carotid artery occlusion     <30% bilateral ICA stenosis, no flow in left vertebral artery   Past Surgical History  Procedure Laterality Date  . Tubal  ligation     Social History:  reports that she has been smoking Cigarettes.  She has a 42 pack-year smoking history. She does not have any smokeless tobacco history on file. She reports that she does not drink alcohol or use illicit drugs. Where does patient live home. Can patient participate in ADLs? Not sure.  Allergies  Allergen Reactions  . Lisinopril     Feels like she is burning up  . Shellfish Allergy Rash    Family History:  Family History  Problem Relation Age of Onset  . Parkinson's disease Father   . Lung cancer Brother   . Diabetes Brother   . Heart attack Brother   . Diabetes Mother   . Hyperlipidemia Mother       Prior to Admission medications   Medication Sig Start Date End Date Taking? Authorizing Provider  albuterol (PROVENTIL HFA;VENTOLIN HFA) 108 (90 BASE) MCG/ACT inhaler Inhale 2 puffs into the lungs every 6 (six) hours as needed. wheezing   Yes Historical Provider, MD  aspirin 325 MG tablet Take 325 mg by mouth daily.   Yes Historical Provider, MD  Cholecalciferol (VITAMIN D) 2000 UNITS tablet Take 2,000 Units by mouth daily.   Yes Historical Provider, MD  CRESTOR 10 MG tablet Take 1 tablet by mouth every evening. 11/17/14  Yes Historical Provider, MD  fluticasone-salmeterol (ADVAIR HFA) 115-21 MCG/ACT inhaler Inhale 2 puffs into the lungs 2 (two) times daily.   Yes Historical Provider, MD  losartan-hydrochlorothiazide (HYZAAR) 100-12.5 MG per tablet Take 1 tablet by mouth daily. 11/17/14  Yes Historical Provider, MD    Physical Exam: Filed Vitals:   12/07/14 2200 12/07/14 2230 12/07/14 2319 12/07/14 2321  BP: 139/56 121/45  156/50  Pulse: 65 66  75  Temp:    98.3 F (36.8 C)  TempSrc:    Oral  Resp: 16 24  22   Height:   5\' 6"  (1.676 m)   Weight:   120.9 kg (266 lb 8.6 oz) 120.9 kg (266 lb 8.6 oz)  SpO2: 96% 96%  95%     General:  Well-developed and nourished.  Eyes: Anicteric no pallor.  ENT: No discharge from the ears eyes nose  normal.  Neck: No mass felt. No neck rigidity.  Cardiovascular: S1-S2 heard.  Respiratory: No rhonchi or crepitations.  Abdomen: Soft nontender bowel sounds present.  Skin: No rash.  Musculoskeletal: No edema.  Psychiatric: Appears normal.  Neurologic: Alert awake oriented to time place and person. Patient has mild weakness on the right upper and lower extremity. No facial asymmetry. Tongue is midline. PERRLA positive.  Labs on Admission:  Basic Metabolic Panel:  Recent Labs Lab 12/07/14 1959  NA 139  K 3.8  CL 102  CO2 28  GLUCOSE 93  BUN 11  CREATININE 0.87  CALCIUM 9.4   Liver Function Tests:  Recent Labs Lab 12/07/14 1959  AST 24  ALT 20  ALKPHOS 56  BILITOT 0.7  PROT 7.3  ALBUMIN 3.8   No results for input(s): LIPASE, AMYLASE in the last 168 hours. No results for input(s): AMMONIA in the last 168 hours. CBC:  Recent Labs Lab 12/07/14 1959  WBC 11.9*  HGB 16.0*  HCT 46.6*  MCV 91.9  PLT 157   Cardiac Enzymes:  Recent Labs Lab 12/07/14 1959  CKTOTAL 16*  TROPONINI <0.03    BNP (last 3 results) No results for input(s): BNP in the last 8760 hours.  ProBNP (last 3 results) No results for input(s): PROBNP in the last 8760 hours.  CBG: No results for input(s): GLUCAP in the last 168 hours.  Radiological Exams on Admission: Dg Chest 2 View  12/07/2014   CLINICAL DATA:  Altered mental status.  Altered speech.  EXAM: CHEST  2 VIEW  COMPARISON:  CT chest 09/14/2014.  PA and lateral chest 09/09/2014.  FINDINGS: The chest is hyperexpanded with peribronchial thickening. No consolidative process, pneumothorax or effusion. Heart size is normal.  IMPRESSION: COPD without acute disease.   Electronically Signed   By: Inge Rise M.D.   On: 12/07/2014 22:28   Ct Head Wo Contrast  12/07/2014   CLINICAL DATA:  Progressive weakness and slurred speech.  EXAM: CT HEAD WITHOUT CONTRAST  TECHNIQUE: Contiguous axial images were obtained from the base of  the skull through the vertex without intravenous contrast.  COMPARISON:  None.  FINDINGS: A single mass lesion versus two contiguous lesions is identified in the left cerebral hemisphere centered in the basal ganglia. Assuming one lesion, it measures 2.8 x 4.3 cm. There is surrounding vasogenic edema. Mass effect is present on the anterior horn of the left lateral ventricle and mild left-to-right shift of 0.5 cm. No other mass is identified. There is no hemorrhage, acute infarction or abnormal extra-axial fluid collection. The calvarium is intact.  IMPRESSION: The study is positive for a single large mass versus two contiguous masses centered in the left basal ganglia. MRI of the brain with and without contrast is recommended for further  evaluation.   Electronically Signed   By: Inge Rise M.D.   On: 12/07/2014 21:43     Assessment/Plan Active Problems:   Brain mass   Essential hypertension   Hyperlipidemia   OSA (obstructive sleep apnea)   Acute UTI (urinary tract infection)   1. Brain mass - on-call neurosurgeon was consulted by ER physician at this time neurosurgeon has recommended to get full workup for possible primary. And based on which neurosurgeon can be reconsulted. At this time since CAT scan does show some vasogenic edema patient has been placed on Decadron. I have ordered MRI brain with and without contrast CT of the chest abdomen pelvis with contrast. Patient did have a PET scan done 2 months ago for her lung nodule which was unremarkable. 2. Hypertension - continue present medications. 3. OSA on C Pap. 4. Hyperlipidemia - continue present medications. 5. Probable COPD - on inhalers presently not wheezing.   DVT Prophylaxis Lovenox.  Code Status: Full code.  Family Communication: Patient's family at the bedside.  Disposition Plan: Admit to inpatient.    Tanya King N. [facility] Pager [phone].  If 7PM-7AM, please contact  night-coverage [email or web address] Password [facility] 12/07/2014, 11:55 PM

## 2014-12-08 ENCOUNTER — Inpatient Hospital Stay (HOSPITAL_COMMUNITY): Payer: 59

## 2014-12-08 DIAGNOSIS — R911 Solitary pulmonary nodule: Secondary | ICD-10-CM

## 2014-12-08 DIAGNOSIS — Z720 Tobacco use: Secondary | ICD-10-CM

## 2014-12-08 LAB — COMPREHENSIVE METABOLIC PANEL
ALT: 19 U/L (ref 0–35)
AST: 19 U/L (ref 0–37)
Albumin: 3.6 g/dL (ref 3.5–5.2)
Alkaline Phosphatase: 51 U/L (ref 39–117)
Anion gap: 8 (ref 5–15)
BUN: 11 mg/dL (ref 6–23)
CALCIUM: 9.2 mg/dL (ref 8.4–10.5)
CHLORIDE: 104 mmol/L (ref 96–112)
CO2: 26 mmol/L (ref 19–32)
Creatinine, Ser: 0.73 mg/dL (ref 0.50–1.10)
GFR calc non Af Amer: >90 mL/min (ref >90)
GLUCOSE: 156 mg/dL — AB (ref 70–99)
Potassium: 4.3 mmol/L (ref 3.5–5.1)
SODIUM: 138 mmol/L (ref 135–145)
Total Bilirubin: 0.6 mg/dL (ref 0.3–1.2)
Total Protein: 6.9 g/dL (ref 6.0–8.3)

## 2014-12-08 LAB — CBC WITH DIFFERENTIAL/PLATELET
BASOS PCT: 0 % (ref 0–1)
Basophils Absolute: 0 10*3/uL (ref 0.0–0.1)
EOS ABS: 0 10*3/uL (ref 0.0–0.7)
Eosinophils Relative: 0 % (ref 0–5)
HCT: 46.8 % — ABNORMAL HIGH (ref 36.0–46.0)
HEMOGLOBIN: 15.9 g/dL — AB (ref 12.0–15.0)
Lymphocytes Relative: 5 % — ABNORMAL LOW (ref 12–46)
Lymphs Abs: 0.4 10*3/uL — ABNORMAL LOW (ref 0.7–4.0)
MCH: 31.4 pg (ref 26.0–34.0)
MCHC: 34 g/dL (ref 30.0–36.0)
MCV: 92.3 fL (ref 78.0–100.0)
Monocytes Absolute: 0.1 10*3/uL (ref 0.1–1.0)
Monocytes Relative: 1 % — ABNORMAL LOW (ref 3–12)
NEUTROS PCT: 94 % — AB (ref 43–77)
Neutro Abs: 9.1 10*3/uL — ABNORMAL HIGH (ref 1.7–7.7)
Platelets: 163 10*3/uL (ref 150–400)
RBC: 5.07 MIL/uL (ref 3.87–5.11)
RDW: 13.9 % (ref 11.5–15.5)
WBC: 9.6 10*3/uL (ref 4.0–10.5)

## 2014-12-08 MED ORDER — CEFTRIAXONE SODIUM IN DEXTROSE 20 MG/ML IV SOLN
1.0000 g | INTRAVENOUS | Status: DC
  Administered 2014-12-08 – 2014-12-09 (×2): 1 g via INTRAVENOUS
  Filled 2014-12-08 (×3): qty 50

## 2014-12-08 MED ORDER — LOSARTAN POTASSIUM 50 MG PO TABS
100.0000 mg | ORAL_TABLET | Freq: Every day | ORAL | Status: DC
  Filled 2014-12-08 (×3): qty 2

## 2014-12-08 MED ORDER — LORAZEPAM 2 MG/ML IJ SOLN
INTRAMUSCULAR | Status: AC
  Filled 2014-12-08: qty 1

## 2014-12-08 MED ORDER — ALBUTEROL SULFATE (2.5 MG/3ML) 0.083% IN NEBU: 2.5000 mg | INHALATION_SOLUTION | Freq: Four times a day (QID) | RESPIRATORY_TRACT | Status: DC | PRN

## 2014-12-08 MED ORDER — LORAZEPAM 2 MG/ML IJ SOLN
1.0000 mg | Freq: Once | INTRAMUSCULAR | Status: AC
  Administered 2014-12-08: 1 mg via INTRAVENOUS

## 2014-12-08 MED ORDER — HYDROCHLOROTHIAZIDE 12.5 MG PO CAPS
12.5000 mg | ORAL_CAPSULE | Freq: Every day | ORAL | Status: DC
  Filled 2014-12-08 (×3): qty 1

## 2014-12-08 MED ORDER — GADOBENATE DIMEGLUMINE 529 MG/ML IV SOLN
20.0000 mL | Freq: Once | INTRAVENOUS | Status: AC | PRN
  Administered 2014-12-08: 20 mL via INTRAVENOUS

## 2014-12-08 MED ORDER — IOHEXOL 300 MG/ML  SOLN
25.0000 mL | INTRAMUSCULAR | Status: AC
  Administered 2014-12-08 (×2): 25 mL via ORAL

## 2014-12-08 MED ORDER — SODIUM CHLORIDE 0.9 % IV SOLN
INTRAVENOUS | Status: AC
  Administered 2014-12-08: 500 mL via INTRAVENOUS

## 2014-12-08 MED ORDER — IOHEXOL 300 MG/ML  SOLN
100.0000 mL | Freq: Once | INTRAMUSCULAR | Status: AC | PRN
  Administered 2014-12-08: 100 mL via INTRAVENOUS

## 2014-12-08 NOTE — Procedures (Signed)
Cpap not placed on pt, per spouse request because pt is sleeping comfortably at this time.  Machine left in pt room for use tomorrow.

## 2014-12-08 NOTE — Consult Note (Signed)
[place]  Telephone:([phone]) [place] CONSULTATION NOTE  Tanya King                                MR#: [id number]  DOB: May 27, 1951                       CSN#: [id number]  Referring MD: Dr. [facility]  Primary MD: Lilian Coma, MD  Reason for Consult: Multiple brain lesions   HPI:Tanya King is a 64 y.o. female admitted with 2-3 week history of progressive gait instability, right lower extremity weakness and  slurred speech.The patient did not report any seizure activity, or loss of consciousness. She denies any blurred vision, double vision or headaches. She denies any incontinence of urine or bowel. The patient denies any respiratory or cardiac complaints. She denies any abdominal pain, nausea or vomiting. She denies any fevers, chills or night sweats. A CT of the head 12/07/2014 without contrast, showed a single mass lesion versus 2 contiguous lesions identified in the left cerebral hemisphere, center in the basal ganglia with surrounding vasogenic edema and mass effect. MRI of the brain confirms multiple enhancing mass lesions in the brain. The largest lesion disease in the left basal ganglia, measuring 23 x 44 mm. This vasogenic edema around the lesions especially in the left basal ganglia. There is no significant central necrosis. 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 Of note, recent PET- CT scan as outpatient, noted to the left lung nodule, although this was not hypermetabolic. Chest x-ray on 12/07/2014, did not show acute disease.Of note, his CT of the abdomen without contrast on 01/22/2009, had shown a  liver nodule of unknown significance, as it was too small to characterize.The patient is currently on IV Decadron. No other staging workup was performed. No primary has been found to date.  We were requested to see this patient with  recommendations.        PMH:  Past Medical History  Diagnosis Date  . Asthma   . Hypercholesteremia   . GERD (gastroesophageal reflux disease)   . Sleep apnea     AHI 116/hr now on BiPAP at 25/52mmHg  . Edema   . HTN (hypertension)   . Allergic rhinitis   . Vitamin B12 deficiency   . RLS (restless legs syndrome)   . Vitamin deficiency   . COPD (chronic obstructive pulmonary disease)   . Obesity   . Aortic stenosis   . Edema     chronic LE edema  . Heart murmur     (mild AV stenosis with no regurg, mild MV sclerosis with mild MR, mod LVH 2012)  . Carotid artery occlusion     <30% bilateral ICA stenosis, no flow in left vertebral artery  She has a history of polycythemia, diagnosed in 2012, now resolved.  Surgeries:  Past Surgical History  Procedure Laterality Date  . Tubal ligation      Allergies:  Allergies  Allergen Reactions  . Lisinopril     Feels like she is burning up  . Shellfish Allergy Rash    Medications:  Scheduled Meds: . aspirin  325 mg Oral Daily  . [START ON 12/09/2014] cefTRIAXone (ROCEPHIN)  IV  1 g Intravenous Q24H  . cholecalciferol  2,000 Units Oral Daily  .  dexamethasone  4 mg Intravenous 4 times per day  . enoxaparin (LOVENOX) injection  40 mg Subcutaneous QHS  . hydrochlorothiazide  12.5 mg Oral Daily  . iohexol  25 mL Oral Q1 Hr x 2  . losartan  100 mg Oral Daily  . mometasone-formoterol  2 puff Inhalation BID  . pantoprazole (PROTONIX) IV  40 mg Intravenous QHS  . rosuvastatin  10 mg Oral QPM   Continuous Infusions:  PRN Meds:.albuterol, senna-docusate QPY:[id number], senna-docusate  ROS: [place] for significant positives, the rest of the review of systems is negative.  Family History:    Family History  Problem Relation Age of Onset  . Parkinson's disease Father   . Lung cancer Brother   . Diabetes Brother   . Heart attack Brother   . Diabetes Mother   . Hyperlipidemia Mother    Mother had bone cancer   Social history:  Patient is married. Lives in [place].1 son. She has a history of tobacco abuse, 1 pack a day for at least 40 years.. He notes that she denies alcohol or recreational drug use.She is retired Surveyor, minerals for the [employer]  Physical Exam   ECOG PERFORMANCE STATUS:  3  Symptomatic, >50% in bed, but not bedbound (Capable of only limited self-care, confined to bed or chair 50% or more of waking hours)      Filed Vitals:   12/08/14 1030  BP: 138/60  Pulse: 70  Temp:   Resp:    Filed Weights   12/07/14 2319 12/07/14 2321  Weight: 266 lb 8.6 oz (120.9 kg) 266 lb 8.6 oz (120.9 kg)    GENERAL:N distress and comfortable. She is lethargic SKIN: skin color, texture, turgor are normal, no rashes or significant lesions EYES: normal, conjunctiva are pink and non-injected, sclera clear OROPHARYNX:no exudate, no erythema and lips, buccal mucosa, and tongue normal  NECK: supple, thyroid normal size, non-tender, without nodularity LYMPH:  no palpable lymphadenopathy in the cervical, axillary or inguinal LUNGS: clear to auscultation and percussion with normal breathing effort HEART: regular rate & rhythm and no murmurs and no lower extremity edema ABDOMEN:abdomen soft,obese non-tender and normal bowel sounds Musculoskeletal:no cyanosis of digits and no clubbing  PSYCH: alert & oriented x 3 with fluent speech NEURO: follows commands. Falls back asleep. Knows person, year, not alert to place.   CBC  Recent Labs Lab 12/07/14 1959 12/08/14 0414  WBC 11.9* 9.6  HGB 16.0* 15.9*  HCT 46.6* 46.8*  PLT 157 163  MCV 91.9 92.3  MCH 31.6 31.4  MCHC 34.3 34.0  RDW 13.9 13.9  LYMPHSABS  --  0.4*  MONOABS  --  0.1  EOSABS  --  0.0  BASOSABS  --  0.0    Anemia panel:  No results for input(s): VITAMINB12, FOLATE, FERRITIN, TIBC, IRON, RETICCTPCT in the last 72 hours.  CMP    Recent Labs Lab 12/07/14 1959 12/08/14 0414  NA 139 138  K 3.8 4.3  CL 102 104  CO2 28 26   GLUCOSE 93 156*  BUN 11 11  CREATININE 0.87 0.73  CALCIUM 9.4 9.2  AST 24 19  ALT 20 19  ALKPHOS 56 51  BILITOT 0.7 0.6        Component Value Date/Time   BILITOT 0.6 12/08/2014 0414     No results for input(s): INR, PROTIME in the last 168 hours.  No results for input(s): DDIMER in the last 72 hours.  Imaging Studies:  Dg Chest 2 View  12/07/2014   CLINICAL DATA:  Altered mental status.  Altered speech.  EXAM: CHEST  2 VIEW  COMPARISON:  CT chest 09/14/2014.  PA and lateral chest 09/09/2014.  FINDINGS: The chest is hyperexpanded with peribronchial thickening. No consolidative process, pneumothorax or effusion. Heart size is normal.  IMPRESSION: COPD without acute disease.   Electronically Signed   By: Inge Rise M.D.   On: 12/07/2014 22:28   Ct Head Wo Contrast  12/07/2014   CLINICAL DATA:  Progressive weakness and slurred speech.  EXAM: CT HEAD WITHOUT CONTRAST  TECHNIQUE: Contiguous axial images were obtained from the base of the skull through the vertex without intravenous contrast.  COMPARISON:  None.  FINDINGS: A single mass lesion versus two contiguous lesions is identified in the left cerebral hemisphere centered in the basal ganglia. Assuming one lesion, it measures 2.8 x 4.3 cm. There is surrounding vasogenic edema. Mass effect is present on the anterior horn of the left lateral ventricle and mild left-to-right shift of 0.5 cm. No other mass is identified. There is no hemorrhage, acute infarction or abnormal extra-axial fluid collection. The calvarium is intact.  IMPRESSION: The study is positive for a single large mass versus two contiguous masses centered in the left basal ganglia. MRI of the brain with and without contrast is recommended for further evaluation.   Electronically Signed   By: Inge Rise M.D.   On: 12/07/2014 21:43   Mr Jeri Cos QZ Contrast  12/08/2014   CLINICAL DATA:  Brain mass  EXAM: MRI HEAD WITHOUT AND WITH CONTRAST  TECHNIQUE: Multiplanar,  multiecho pulse sequences of the brain and surrounding structures were obtained without and with intravenous contrast.  CONTRAST:  47mL MULTIHANCE GADOBENATE DIMEGLUMINE 529 MG/ML IV SOLN  COMPARISON:  CT head 12/07/2014  FINDINGS: Image quality degraded by moderate motion. The patient did not hold still which became progressive during the exam.  Multiple masses are present in the brain. The largest mass is centered in the left basal ganglia and has a moderate amount of surrounding vasogenic edema. This is the lesion identified on CT. This mass shows relatively solid enhancement. The enhancing component measures approximately 23 x 44 mm.  10 mm enhancing nodule in the right mid temporal lobe.  11 mm enhancing mass in the right medial temporal lobe.  5 x 10 mm enhancing lesion in the right optic radiation  5 x 9 mm enhancing lesion in the right medial parietal lobe  13 mm enhancing cortical lesion in the left parietal cortex.  5 mm enhancing lesion right parietal cortex over the convexity.  The enhancing lesion show restricted diffusion. The large left basal ganglia lesion is iso to slightly hyper dense to cortex on CT. No necrosis identified.  Negative for intracranial hemorrhage. Ventricle size is normal. Mild midline shift to the right of approximately 5 mm.  IMPRESSION: Multiple enhancing mass lesions in the brain. The largest lesion is in the left basal ganglia. There is vasogenic edema around the lesions especially in the left basal ganglia. No significant central necrosis is seen in the lesions. 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 However the typical periventricular location of lesions is not a prominent feature in this case.  Differential diagnosis would include metastatic disease and multifocal GBM. Note is made of a left lung nodule which was not hypermetabolic on  recent PET-CT.  Infection not considered likely. Biopsy will be necessary for diagnosis.   Electronically Signed   By: Franchot Gallo M.D.   On: 12/08/2014 07:48   Breast, left, needle core biopsy, mass, UOQ - BENIGN BREAST PARENCHYMA WITH FIBROCYSTIC CHANGE, SCLEROSING ADENOSIS, AND MICROCALCIFICATIONS). - NO ATYPIA OR MALIGNANCY PRESENT. - SEE COMMENT. Microscopic Comment Immunostains for smooth muscle myosin heavy chain, p63, and calponin demonstrate preservation of the  myoepithelial layer, confirming the morphologic impression. The case was reviewed with Dr. Saralyn Pilar who concurs. (CRR:gt, 07/12/11) Mali RUND DO Pathologist, Electronic Signature (Case signed 07/12/2011)  Assessment/Plan: 64 y.o.   Multiple Brain Lesions She was admitted with 2-3 week history of progressive gait instability, right lower extremity weakness and  slurred speech.  MRI of the brain confirms multiple enhancing mass lesions in the brain. The largest lesion disease in the left basal ganglia, measuring 23 x 44 mm. This vasogenic edema around the lesions especially in the left basal ganglia. There is no significant central necrosis. 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  Recent PET- CT scan as outpatient, noted to the left lung nodule, although this was not hypermetabolic. Chest x-ray on 12/07/2014, did not show acute disease. Of note,CT of the abdomen without contrast on 01/22/2009, had shown a GYN liver nodule of unknown significance, as it was too small to characterize. The patient is currently on IV Decadron.  Agree with CT of the chest, abdomen and pelvis to complete the staging, perhaps find occult disease, from which a biopsy may be obtained. If no other lesions are visible, then neurosurgery is to proceed with further workup, including the possibility of the biopsy for tissue diagnosis. Based on the findings, the patient may need  a radiation oncology evaluation as well Will await for the results, and we will proceed with further recommendations  History of tobacco abuse Tobacco cessation program is recommended  Full Code   Other medical issues as per admitting team   [facility] E, PA-C 12/08/2014 2:06 PM  ADDENDUM:  I saw and examined the patient. I agree with the above assessment.  This clearly, in my mind, is metastatic disease if this is proven to be cancer. It certainly looks suspicious for metastatic brain metastases.  Of note, a CT scan done back in November showed a 1.47 m left lung nodule. There was some mediastinal adenopathy. A PET scan done in December was negative which would suggest a benign etiology.  She has not had any skin cancers removed. There is no obvious history of melanoma.  She gets her mammograms routinely according to her husband.  She last had a colonoscopy a couple years ago.  She currently is having CT scans of the chest, abdomen and pelvis.  It is distinctly possible that she may need to have a biopsy one of the brain lesions.  The "bottom line" is that we are going to need biopsies. We need tissue. As the old saying goes, "no meat no treat".  I agree with the Decadron.  I probably would get her off the aspirin. This would be a anticipation of a biopsy.  On exam, cannot find anything that is specific. I cannot palpate any adenopathy. There is no palpable liver or spleen  I would not send off any tumor markers right now.  I spoke to her husband and son. They're both very nice. The patient was a little bit lethargic. She did wake up a little bit  as we've got to the meeting. She does seem very nice. I'm not sure how much she really understands as to what is going on right now.  Of note, she did have a left breast biopsy about 3-1/2 years ago, this was negative for any malignancy.  I answered her family questions. In essence, I told them that there really was not much  that we can say until we really new where all this was coming from and that would dictate what the options would be.  I do think it would be helpful to get radiation therapy involved. I would believe that she will definitely need radiation no matter what we find. The only exception to this would be if she has a primary CNS lymphoma which would be highly unusual given this type of presentation.  I will follow along. I appreciate all the great care that she is getting.   Pete E.  Isaiah 41:10

## 2014-12-08 NOTE — Progress Notes (Signed)
[facility] PROGRESS NOTE  Tanya King PPI:[id number] DOB: [birth year] DOA: 12/07/2014 PCP: Lilian Coma, MD  Assessment/Plan: Active Problems:   Brain mass - s/p MRI of brain, discussed results with family - consulted heme/onc - pt is currently on decadron    Essential hypertension - Pt is on losartan and hydrochlorothiazide    Hyperlipidemia - stable, patient is on crestor    Acute UTI (urinary tract infection) - continue rocephin - f/u with urine culture and sensitivity   Code Status: full Family Communication: discussed with husband at bedside Disposition Plan: Awaiting further work up   Consultants:  Oncology  Procedures:  MRI of brain  Antibiotics:  rocephin  HPI/Subjective: Pt has no new complaints no acute issues reported overnight.  Objective: Filed Vitals:   12/08/14 1500  BP: 132/53  Pulse: 77  Temp: 97.6 F (36.4 C)  Resp: 18    Intake/Output Summary (Last 24 hours) at 12/08/14 1706 Last data filed at 12/08/14 1500  Gross per 24 hour  Intake      0 ml  Output    925 ml  Net   -925 ml   Filed Weights   12/07/14 2319 12/07/14 2321  Weight: 120.9 kg (266 lb 8.6 oz) 120.9 kg (266 lb 8.6 oz)    Exam:   General:  Pt is somnolent but aroussable   Cardiovascular: rrr, no mrg  Respiratory: cta bl, no wheezes  Abdomen: soft, NT, nd  Musculoskeletal: no clubbing  Data Reviewed: Basic Metabolic Panel:  Recent Labs Lab 12/07/14 1959 12/08/14 0414  NA 139 138  K 3.8 4.3  CL 102 104  CO2 28 26  GLUCOSE 93 156*  BUN 11 11  CREATININE 0.87 0.73  CALCIUM 9.4 9.2   Liver Function Tests:  Recent Labs Lab 12/07/14 1959 12/08/14 0414  AST 24 19  ALT 20 19  ALKPHOS 56 51  BILITOT 0.7 0.6  PROT 7.3 6.9  ALBUMIN 3.8 3.6   No results for input(s): LIPASE, AMYLASE in the last 168 hours. No results for input(s): AMMONIA in the last 168 hours. CBC:  Recent Labs Lab 12/07/14 1959 12/08/14 0414  WBC 11.9*  9.6  NEUTROABS  --  9.1*  HGB 16.0* 15.9*  HCT 46.6* 46.8*  MCV 91.9 92.3  PLT 157 163   Cardiac Enzymes:  Recent Labs Lab 12/07/14 1959  CKTOTAL 64*  TROPONINI <0.03   BNP (last 3 results) No results for input(s): BNP in the last 8760 hours.  ProBNP (last 3 results) No results for input(s): PROBNP in the last 8760 hours.  CBG: No results for input(s): GLUCAP in the last 168 hours.  No results found for this or any previous visit (from the past 240 hour(s)).   Studies: Dg Chest 2 View  12/07/2014   CLINICAL DATA:  Altered mental status.  Altered speech.  EXAM: CHEST  2 VIEW  COMPARISON:  CT chest 09/14/2014.  PA and lateral chest 09/09/2014.  FINDINGS: The chest is hyperexpanded with peribronchial thickening. No consolidative process, pneumothorax or effusion. Heart size is normal.  IMPRESSION: COPD without acute disease.   Electronically Signed   By: Inge Rise M.D.   On: 12/07/2014 22:28   Ct Head Wo Contrast  12/07/2014   CLINICAL DATA:  Progressive weakness and slurred speech.  EXAM: CT HEAD WITHOUT CONTRAST  TECHNIQUE: Contiguous axial images were obtained from the base of the skull through the vertex without intravenous contrast.  COMPARISON:  None.  FINDINGS: A single  mass lesion versus two contiguous lesions is identified in the left cerebral hemisphere centered in the basal ganglia. Assuming one lesion, it measures 2.8 x 4.3 cm. There is surrounding vasogenic edema. Mass effect is present on the anterior horn of the left lateral ventricle and mild left-to-right shift of 0.5 cm. No other mass is identified. There is no hemorrhage, acute infarction or abnormal extra-axial fluid collection. The calvarium is intact.  IMPRESSION: The study is positive for a single large mass versus two contiguous masses centered in the left basal ganglia. MRI of the brain with and without contrast is recommended for further evaluation.   Electronically Signed   By: Inge Rise M.D.    On: 12/07/2014 21:43   Mr Jeri Cos PX Contrast  12/08/2014   CLINICAL DATA:  Brain mass  EXAM: MRI HEAD WITHOUT AND WITH CONTRAST  TECHNIQUE: Multiplanar, multiecho pulse sequences of the brain and surrounding structures were obtained without and with intravenous contrast.  CONTRAST:  72mL MULTIHANCE GADOBENATE DIMEGLUMINE 529 MG/ML IV SOLN  COMPARISON:  CT head 12/07/2014  FINDINGS: Image quality degraded by moderate motion. The patient did not hold still which became progressive during the exam.  Multiple masses are present in the brain. The largest mass is centered in the left basal ganglia and has a moderate amount of surrounding vasogenic edema. This is the lesion identified on CT. This mass shows relatively solid enhancement. The enhancing component measures approximately 23 x 44 mm.  10 mm enhancing nodule in the right mid temporal lobe.  11 mm enhancing mass in the right medial temporal lobe.  5 x 10 mm enhancing lesion in the right optic radiation  5 x 9 mm enhancing lesion in the right medial parietal lobe  13 mm enhancing cortical lesion in the left parietal cortex.  5 mm enhancing lesion right parietal cortex over the convexity.  The enhancing lesion show restricted diffusion. The large left basal ganglia lesion is iso to slightly hyper dense to cortex on CT. No necrosis identified.  Negative for intracranial hemorrhage. Ventricle size is normal. Mild midline shift to the right of approximately 5 mm.  IMPRESSION: Multiple enhancing mass lesions in the brain. The largest lesion is in the left basal ganglia. There is vasogenic edema around the lesions especially in the left basal ganglia. No significant central necrosis is seen in the lesions. 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 However the typical periventricular location of lesions is not a prominent feature in  this case.  Differential diagnosis would include metastatic disease and multifocal GBM. Note is made of a left lung nodule which was not hypermetabolic on recent PET-CT. Infection not considered likely. Biopsy will be necessary for diagnosis.   Electronically Signed   By: Franchot Gallo M.D.   On: 12/08/2014 07:48    Scheduled Meds: . aspirin  325 mg Oral Daily  . [START ON 12/09/2014] cefTRIAXone (ROCEPHIN)  IV  1 g Intravenous Q24H  . cholecalciferol  2,000 Units Oral Daily  . dexamethasone  4 mg Intravenous 4 times per day  . enoxaparin (LOVENOX) injection  40 mg Subcutaneous QHS  . hydrochlorothiazide  12.5 mg Oral Daily  . losartan  100 mg Oral Daily  . mometasone-formoterol  2 puff Inhalation BID  . pantoprazole (PROTONIX) IV  40 mg Intravenous QHS  . rosuvastatin  10 mg Oral  QPM   Continuous Infusions:    Time spent: > 35 minutes    Velvet Bathe  [facility] Pager [phone] If 7PM-7AM, please contact night-coverage at [email or web address], password [facility] 12/08/2014, 5:06 PM  LOS: 1 day

## 2014-12-08 NOTE — Progress Notes (Signed)
OT Cancellation Note  Patient Details Name: Diantha Paxson MRN: [id number] DOB: 1950-12-01   Cancelled Treatment:    Reason Eval/Treat Not Completed: Fatigue/lethargy limiting ability to participate;Patient's level of consciousness.  Will check back later today or tomorrow.  Mikaelyn Arthurs 12/08/2014, 12:24 PM  Lesle Chris, OTR/L [phone] 12/08/2014

## 2014-12-08 NOTE — Progress Notes (Signed)
Tanya King came up from ED to floor to complete bedside swallow evaluation.  Patient passed with no difficulty. Will monitor patient.Tanya King

## 2014-12-08 NOTE — Progress Notes (Signed)
Pt resting comfortably at this time on 2 LPM [place] and tolerating well.  Pt family has decided for RT to not setup CPAP machine at this time. Family stated that when she woke up they would ask her if she wanted to use machine.  RN notified, RT to monitor and assess as needed.

## 2014-12-08 NOTE — Evaluation (Signed)
SLP Cancellation Note  Patient Details Name: Tanya King MRN: [id number] DOB: 03-Oct-1951   Cancelled treatment:       Reason Eval/Treat Not Completed: Fatigue/lethargy limiting ability to participate (per RN, pt too lethargic to participate in speech/language evaluation, will defer until next date)   Luanna Salk, [facility] SLP [phone]

## 2014-12-08 NOTE — Progress Notes (Signed)
PT Cancellation Note  Patient Details Name: Tanya King MRN: [id number] DOB: 1951-04-06   Cancelled Treatment:    Reason Eval/Treat Not Completed: Attempted evals-pt drowsy/lethargic from meds given earlier per nursing/family. Will attempt to check back as schedule permits.    Weston Anna, MPT Pager: [phone]

## 2014-12-08 NOTE — Progress Notes (Signed)
PT Cancellation Note  Patient Details Name: Tanya King MRN: [id number] DOB: 31-May-1951   Cancelled Treatment:    Reason Eval/Treat Not Completed: Medical issues which prohibited therapy-Checked back to work with pt. Pt more alert but now she is drinking contrast for CT scan. Will check back on tomorrow to perform PT eval. Thanks.    Weston Anna, MPT Pager: [phone]

## 2014-12-09 ENCOUNTER — Other Ambulatory Visit (HOSPITAL_COMMUNITY): Payer: Self-pay | Admitting: Neurosurgery

## 2014-12-09 DIAGNOSIS — N281 Cyst of kidney, acquired: Secondary | ICD-10-CM

## 2014-12-09 LAB — LACTATE DEHYDROGENASE: LDH: 174 U/L (ref 94–250)

## 2014-12-09 MED ORDER — PANTOPRAZOLE SODIUM 40 MG PO TBEC
40.0000 mg | DELAYED_RELEASE_TABLET | Freq: Every day | ORAL | Status: DC
Start: 2014-12-09 — End: 2014-12-10
  Administered 2014-12-09: 40 mg via ORAL
  Filled 2014-12-09 (×2): qty 1

## 2014-12-09 MED ORDER — DEXAMETHASONE 4 MG PO TABS
8.0000 mg | ORAL_TABLET | Freq: Three times a day (TID) | ORAL | Status: DC
  Administered 2014-12-09 – 2014-12-10 (×4): 8 mg via ORAL
  Filled 2014-12-09 (×8): qty 2

## 2014-12-09 NOTE — Evaluation (Signed)
Speech Language Pathology Evaluation Patient Details Name: Tanya King MRN: [id number] DOB: 17-Jan-1951 Today's Date: 12/09/2014 Time: 8841-6606 SLP Time Calculation (min) (ACUTE ONLY): 39 min  Problem List:  Patient Active Problem List   Diagnosis Date Noted  . Brain mass 12/07/2014  . Essential hypertension 12/07/2014  . Hyperlipidemia 12/07/2014  . OSA (obstructive sleep apnea) 12/07/2014  . Acute UTI (urinary tract infection)   . Tobacco abuse counseling 07/26/2013  . Asthma   . Hypercholesteremia   . GERD (gastroesophageal reflux disease)   . Sleep apnea   . Edema   . HTN (hypertension)   . Allergic rhinitis   . Vitamin B12 deficiency   . RLS (restless legs syndrome)   . Vitamin deficiency   . COPD (chronic obstructive pulmonary disease)   . Obesity   . Aortic stenosis    Past Medical History:  Past Medical History  Diagnosis Date  . Asthma   . Hypercholesteremia   . GERD (gastroesophageal reflux disease)   . Sleep apnea     AHI 116/hr now on BiPAP at 25/13mmHg  . Edema   . HTN (hypertension)   . Allergic rhinitis   . Vitamin B12 deficiency   . RLS (restless legs syndrome)   . Vitamin deficiency   . COPD (chronic obstructive pulmonary disease)   . Obesity   . Aortic stenosis   . Edema     chronic LE edema  . Heart murmur     (mild AV stenosis with no regurg, mild MV sclerosis with mild MR, mod LVH 2012)  . Carotid artery occlusion     <30% bilateral ICA stenosis, no flow in left vertebral artery   Past Surgical History:  Past Surgical History  Procedure Laterality Date  . Tubal ligation     HPI:  Pt is a 64 yo female adm to [facility] with right lower extremity weakness and slurred speech x2-3 weeks.  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  PMH + for smoking, asthma, HTN.  Pt reports difficulties with communication that "comes and goes" since January 2016.  Speech evalution ordered.    Assessment / Plan / Recommendation Clinical  Impression  Pt presents with primarily expressive language difficulties resulting in difficulty with word finding.  SlP faciliated expressive language by providing pt with initial sound or articulation cue which was effective.  Repetition skills completely intact.  Pt did mix right/left several times during oral motor exam.     Pt's pragmatics are intact and she appears to "cover" her expressive deficits by socially appropriate pragmatics.  Speech abilities are near baseline per pt, spouse and son.  Pt was oriented x4 during session.  She diid demonstrate decreased memory skills (working, short term and long term)- suspect attention impacting her working and short term memory abilities.    Pt able to read calendar on wall accurately but inferencing sentence level material was difficult.  Prior to a few months ago, pt frequently read on her [place] and she had stopped per spouse, this was the first sign of something being "wrong".  Spouse reports he can manage all home duties currently as he is also retired.  Recommend follow up SLP at OP to maximize pt's functional expressive language abilities.  Educated pt/family to findings, compensation strategies and recommendations.  Thanks for this consult.     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  Overall Cognitive Status: Impaired/Different from baseline Arousal/Alertness: Awake/alert Orientation Level: Oriented to person;Oriented to place;Oriented to  time;Oriented to situation Attention: Sustained;Selective Sustained Attention: Appears intact (for basic tasks over a few minutes) Memory: Impaired Memory Impairment: Storage deficit;Retrieval deficit;Decreased recall of new information (pt did not recall OT leaving foam for pt nor when she retired) Awareness: Impaired Awareness Impairment: Intellectual impairment (decreased awareness to expressive language deficits, pt compensates via pragmatics)    Comprehension  Auditory Comprehension Yes/No Questions: Not tested Commands:  (intact for 2 step commands) Conversation: Simple Interfering Components: Processing speed;Motor planning EffectiveTechniques: Extra processing time;Repetition;Pausing Visual Recognition/Discrimination Discrimination: Not tested Reading Comprehension Reading Status: Impaired (impaired at higher inference levels, read calendar adequately) Interfering Components: Processing time;Working memory    Expression Expression Primary Mode of Expression: Verbal Verbal Expression Overall Verbal Expression: Impaired Initiation: No impairment Automatic Speech: Name;Counting (WFL) Level of Generative/Spontaneous Verbalization: Sentence Repetition: No impairment Naming: No impairment (for 3 pictures) Pragmatics: No impairment Effective Techniques: Phonemic cues;Articulatory cues Non-Verbal Means of Communication: Not applicable Written Expression Dominant Hand: Right Written Expression: Not tested (due to pt's weakness with right hand)   Oral / Motor Oral Motor/Sensory Function Overall Oral Motor/Sensory Function: Impaired Labial Strength: Reduced (minimal drooling right labial region, pt reports present since January) Labial Sensation: Reduced (secretions noted at right labial region, pt benefited from cues to clear) Facial Sensation: Reduced Velum: Within Functional Limits Mandible: Within Functional Limits Motor Speech Overall Motor Speech: Appears within  functional limits for tasks assessed (family reports pt's speech is near baseline) Respiration: Within functional limits Phonation: Normal Resonance: Within functional limits Articulation: Within functional limitis Intelligibility: Intelligible Motor Planning: Witnin functional limits Motor Speech Errors: Not applicable   [place], [facility] SLP [phone]

## 2014-12-09 NOTE — Progress Notes (Addendum)
Mrs. Alcaide looks much better this morning. She had a good night. She is much more alert. She slept well. She had her CAT scan done. This really did not show anything that was new. She had the lingular nodule which actually looks a little bit smaller. 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  I believe that we will have to get neurosurgery involved. I think that she will need a brain biopsy of one of these lesions. I think this will be the procedure that will give [place] the highest yield for a positive result.  I still would have to think that the odds are lung cancer. However, this could be melanoma. I don't see anything that would suggest breast cancer. I do not see anything that would suggest kidney cancer.  She's not hurting. She's having no fever. She has a little bit of a cough.  Her LDH is normal at 174.  On her physical exam, her vital signs are all stable. Blood pressure is 152/57. Temperature 98.1. Pulse 72.  There is nothing new on her physical exam compared to yesterday.  Again, I think that she will need neurosurgery to evaluate her.  I would hope that she might feel to go home and then by mouth and come back for a procedure. She looks quite good. She is much more conversant. She has a good family to help her out. Since nothing happens over the weekend anyway, I would think that she might be able to go home tomorrow and then follow-up as an outpatient and be admitted for the brain biopsy.  She is on aspirin. She'll have to be off aspirin. Again, this will definitely push back her biopsy.  I spoke to she and her husband this morning. They certainly would like to go home if possible.  I would watch her today. If all looks good today, I would consider letting her go home after neurosurgery sees her.  We had an excellent prayer session. She has a very strong faith.   Harriette Ohara 29:11

## 2014-12-09 NOTE — Evaluation (Signed)
Occupational Therapy Evaluation Patient Details Name: Nonna Renninger MRN: [id number] DOB: 10/22/1950 Today's Date: 12/09/2014    History of Present Illness Azra Abrell is a 64 y.o. female with known history of hypertension, hyperlipidemia, probable COPD, tobacco abuse presents to the ER because of increasing difficulty walking due to balance issues and also patient's family found the patient has been having some weakness on the right and lower extremities and some slurred speech. Patient's symptoms have been ongoing for last 2-3 weeks. Patient did not have any loss of  CT head which shows brain mass.    Clinical Impression   Pt was admitted for the above.  She will benefit from skilled OT to increase safety and independence with adls and bathroom transfers as well as increasing strength and coordination in R, dominant hand.  Pt was independent with adls prior to a few weeks ago; she has needed some intermittent min A since then.      Follow Up Recommendations  Outpatient OT (as long as family can provide transportation; OT cannot stand alone for home health)    Equipment Recommendations   (family will look into shower seat)    Recommendations for Other Services       Precautions / Restrictions Precautions Precautions: Fall Restrictions Weight Bearing Restrictions: No      Mobility Bed Mobility Overal bed mobility: Modified Independent                Transfers Overall transfer level: Needs assistance Equipment used: None Transfers: Sit to/from Stand Sit to Stand: Min guard         General transfer comment: for safety    Balance Overall balance assessment: Modified Independent                                          ADL Overall ADL's : Needs assistance/impaired     Grooming: Oral care;Supervision/safety;Standing   Upper Body Bathing: Set up;Sitting   Lower Body Bathing: Moderate assistance;Sit to/from stand   Upper Body  Dressing : Set up;Sitting   Lower Body Dressing: Moderate assistance;Sit to/from stand   Toilet Transfer: Min guard;Stand-pivot (to chair without AD)             General ADL Comments: Pt's RUE has edema.  Performed retrograde massage adn recommended family have her make a fist and release often during the day.  Educated on positioning for edema control.  Son removed R ring and will put it on her chain around her neck as finger is quite swollen.  Used built up foam on spoon for self feeding.  Encouraged family to have pt use R hand as much as she can     Estate agent      Pertinent Vitals/Pain Pain Assessment: No/denies pain     Hand Dominance Right   Extremity/Trunk Assessment Upper Extremity Assessment Upper Extremity Assessment: RUE deficits/detail RUE Deficits / Details: grip -4/5, weaker than L RUE Sensation: decreased light touch RUE Coordination: decreased fine motor      Cervical / Trunk Assessment Cervical / Trunk Assessment: Normal   Communication Communication Communication: No difficulties   Cognition Arousal/Alertness: Awake/alert Behavior During Therapy: WFL for tasks assessed/performed Overall Cognitive Status: Impaired/Different from baseline Area of Impairment: Memory   Current Attention Level: Sustained Memory: Decreased short-term memory   Safety/Judgement: Decreased  awareness of safety         General Comments       Exercises   Other Exercises Other Exercises: AROM R hand and thumb flexion, extension Other Exercises: pt is able to oppose to 2nd and 3rd fingers   Shoulder Instructions      Home Living Family/patient expects to be discharged to:: Private residence Living Arrangements: Spouse/significant other Available Help at Discharge: Family;Available 24 hours/day Type of Home: House Home Access: Stairs to enter [employer] of Steps: 4 Entrance Stairs-Rails: Left Home Layout: One level      Bathroom Shower/Tub: Tub/shower unit Shower/tub characteristics: Curtain Biochemist, clinical: Standard     Home Equipment: Environmental consultant - 2 wheels;Walker - 4 wheels;Bedside commode;Cane - single point   Additional Comments: family plans to get shower seat:  have a hand held shower      Prior Functioning/Environment Level of Independence: Needs assistance        Comments: occasional assistance for socks recently    OT Diagnosis: Generalized weakness;Cognitive deficits   OT Problem List: Decreased strength;Decreased activity tolerance;Impaired balance (sitting and/or standing);Decreased cognition;Decreased safety awareness;Decreased knowledge of use of DME or AE;Impaired UE functional use   OT Treatment/Interventions: Self-care/ADL training;DME and/or AE instruction;Therapeutic exercise;Patient/family education;Balance training    OT Goals(Current goals can be found in the care plan section) Acute Rehab OT Goals Patient Stated Goal: improve R hand function OT Goal Formulation: With patient/family Time For Goal Achievement: 12/23/14 Potential to Achieve Goals: Good ADL Goals Pt Will Transfer to Toilet: with supervision;ambulating;bedside commode Pt Will Perform Tub/Shower Transfer: Tub transfer;ambulating;shower seat;with min guard assist;tub bench;rolling walker Additional ADL Goal #1: pt will be supervision with written HEP with family cueing her  OT Frequency: Min 2X/week   Barriers to D/C:            Co-evaluation              End of Session    Activity Tolerance: Patient tolerated treatment well Patient left: in chair;with call bell/phone within reach;with chair alarm set;with family/visitor present   Time: 8676-1950 OT Time Calculation (min): 35 min Charges:  OT General Charges $OT Visit: 1 Procedure OT Evaluation $Initial OT Evaluation Tier I: 1 Procedure OT Treatments $Self Care/Home Management : 8-22 mins G-Codes:    Dellar Traber Jan 06, 2015, 12:31  PM  Lesle Chris, OTR/L [phone] 01/06/15

## 2014-12-09 NOTE — Progress Notes (Signed)
12/09/14  1005 Paged Dr Wendee Beavers regarding patient BP. Rt arm 147/41 and Lt arm 89/51.  Per family this difference has been noted by family Physician.  Pt due to have Cozaar and HCTZ.  Per MD hold am dose.

## 2014-12-09 NOTE — Progress Notes (Signed)
[facility] PROGRESS NOTE  Tanya King IOE:[id number] DOB: January 07, 1951 DOA: 12/07/2014 PCP: Lilian Coma, MD  Assessment/Plan: Active Problems:   Brain mass - s/p MRI of brain, discussed results with family - consulted heme/onc who currently recommends biopsy of lesions located in brain. Consulted Neurosurgeon who is currently planning on biopsy for Monday. Plan will be to d/c patient home over the weekend (per their preference) to f/u on Monday for procedure. - Will continue decadron    Essential hypertension - Pt is on losartan and hydrochlorothiazide    Hyperlipidemia - stable, patient is on crestor    Acute UTI (urinary tract infection) - continue rocephin - f/u with urine culture and sensitivity currently pending   Code Status: full Family Communication: discussed with husband at bedside Disposition Plan: Awaiting further work up   Consultants:  Oncology  Neurosurgery  Procedures:  MRI of brain  Antibiotics:  rocephin  HPI/Subjective: Family and patient wish to go home over the weekend and come back for biopsy on Monday.  Objective: Filed Vitals:   12/09/14 0957  BP: 147/41  Pulse: 72  Temp:   Resp:     Intake/Output Summary (Last 24 hours) at 12/09/14 1322 Last data filed at 12/08/14 2200  Gross per 24 hour  Intake    170 ml  Output   1250 ml  Net  -1080 ml   Filed Weights   12/07/14 2319 12/07/14 2321  Weight: 120.9 kg (266 lb 8.6 oz) 120.9 kg (266 lb 8.6 oz)    Exam:   General:  Pt is awake and alert  Cardiovascular: rrr, no mrg  Respiratory: cta bl, no wheezes  Abdomen: soft, NT, nd  Musculoskeletal: no clubbing  Data Reviewed: Basic Metabolic Panel:  Recent Labs Lab 12/07/14 1959 12/08/14 0414  NA 139 138  K 3.8 4.3  CL 102 104  CO2 28 26  GLUCOSE 93 156*  BUN 11 11  CREATININE 0.87 0.73  CALCIUM 9.4 9.2   Liver Function Tests:  Recent Labs Lab 12/07/14 1959 12/08/14 0414  AST 24 19  ALT 20  19  ALKPHOS 56 51  BILITOT 0.7 0.6  PROT 7.3 6.9  ALBUMIN 3.8 3.6   No results for input(s): LIPASE, AMYLASE in the last 168 hours. No results for input(s): AMMONIA in the last 168 hours. CBC:  Recent Labs Lab 12/07/14 1959 12/08/14 0414  WBC 11.9* 9.6  NEUTROABS  --  9.1*  HGB 16.0* 15.9*  HCT 46.6* 46.8*  MCV 91.9 92.3  PLT 157 163   Cardiac Enzymes:  Recent Labs Lab 12/07/14 1959  CKTOTAL 33*  TROPONINI <0.03   BNP (last 3 results) No results for input(s): BNP in the last 8760 hours.  ProBNP (last 3 results) No results for input(s): PROBNP in the last 8760 hours.  CBG: No results for input(s): GLUCAP in the last 168 hours.  Recent Results (from the past 240 hour(s))  Urine culture     Status: None (Preliminary result)   Collection Time: 12/07/14  8:30 PM  Result Value Ref Range Status   Specimen Description URINE, CLEAN CATCH  Final   Special Requests NONE  Final   Colony Count   Final    >=100,000 COLONIES/ML Performed at [facility]    Culture   Final    ESCHERICHIA COLI Performed at [facility]    Report Status PENDING  Incomplete     Studies: Dg Chest 2 View  12/07/2014   CLINICAL DATA:  Altered mental status.  Altered speech.  EXAM: CHEST  2 VIEW  COMPARISON:  CT chest 09/14/2014.  PA and lateral chest 09/09/2014.  FINDINGS: The chest is hyperexpanded with peribronchial thickening. No consolidative process, pneumothorax or effusion. Heart size is normal.  IMPRESSION: COPD without acute disease.   Electronically Signed   By: Inge Rise M.D.   On: 12/07/2014 22:28   Ct Head Wo Contrast  12/07/2014   CLINICAL DATA:  Progressive weakness and slurred speech.  EXAM: CT HEAD WITHOUT CONTRAST  TECHNIQUE: Contiguous axial images were obtained from the base of the skull through the vertex without intravenous contrast.  COMPARISON:  None.  FINDINGS: A single mass lesion versus two contiguous lesions is identified in the left cerebral  hemisphere centered in the basal ganglia. Assuming one lesion, it measures 2.8 x 4.3 cm. There is surrounding vasogenic edema. Mass effect is present on the anterior horn of the left lateral ventricle and mild left-to-right shift of 0.5 cm. No other mass is identified. There is no hemorrhage, acute infarction or abnormal extra-axial fluid collection. The calvarium is intact.  IMPRESSION: The study is positive for a single large mass versus two contiguous masses centered in the left basal ganglia. MRI of the brain with and without contrast is recommended for further evaluation.   Electronically Signed   By: Inge Rise M.D.   On: 12/07/2014 21:43   Ct Chest W Contrast  12/08/2014   CLINICAL DATA:  Newly diagnosed brain mass, shortness of breath, weakness, history of asthma, GERD, hypertension, COPD, smoker  EXAM: CT CHEST, ABDOMEN, AND PELVIS WITH CONTRAST  TECHNIQUE: Multidetector CT imaging of the chest, abdomen and pelvis was performed following the standard protocol during bolus administration of intravenous contrast. Sagittal and coronal MPR images reconstructed from axial data set.  CONTRAST:  150mL OMNIPAQUE IOHEXOL 300 MG/ML SOLN IV. Dilute oral contrast.  COMPARISON:  PET-CT 09/21/2014  FINDINGS: CT CHEST FINDINGS  Scattered atherosclerotic calcifications aorta and coronary arteries.  No thoracic adenopathy.  Normal size precarinal lymph node 8 mm diameter image 19.  Dependent atelectasis in both lower lobes.  12 x 10 mm lingular nodule image 32, previously 13 x 11 mm, demonstrated no FDG avidity.  Tiny RIGHT middle lobe nodule 4 mm diameter image 32 unchanged.  Slight chronic accentuation of interstitial markings in the RIGHT upper lobe.  Remaining lungs clear without infiltrate, pleural effusion, pneumothorax or additional mass/ nodule.  Stable small sclerotic focus lateral RIGHT seventh rib in image 38.  No additional osseous findings.  CT ABDOMEN AND PELVIS FINDINGS  16 x 15 mm low-attenuation  focus RIGHT lobe liver image 58 unchanged question small cyst.  Liver, gallbladder, spleen, pancreas, kidneys, and adrenal glands otherwise unremarkable.  Scattered atherosclerotic calcification without aneurysm.  Normal appendix.  Stomach and bowel loops grossly unremarkable for technique.  Unremarkable bladder, ureters, uterus and adnexa.  BILATERAL inguinal and tiny umbilical hernias containing fat.  No mass, adenopathy, free fluid, free air, or acute osseous findings.  IMPRESSION: Stable 12 x 10 mm lingular and 4 mm RIGHT middle lobe nodules.  Bibasilar atelectasis.  Question small hepatic cyst.  BILATERAL inguinal and tiny umbilical hernias.  No other definite intrathoracic, intra-abdominal or intrapelvic abnormalities.   Electronically Signed   By: Lavonia Dana M.D.   On: 12/08/2014 21:58   Mr Jeri Cos ZO Contrast  12/08/2014   CLINICAL DATA:  Brain mass  EXAM: MRI HEAD WITHOUT AND WITH CONTRAST  TECHNIQUE: Multiplanar, multiecho pulse sequences of  the brain and surrounding structures were obtained without and with intravenous contrast.  CONTRAST:  29mL MULTIHANCE GADOBENATE DIMEGLUMINE 529 MG/ML IV SOLN  COMPARISON:  CT head 12/07/2014  FINDINGS: Image quality degraded by moderate motion. The patient did not hold still which became progressive during the exam.  Multiple masses are present in the brain. The largest mass is centered in the left basal ganglia and has a moderate amount of surrounding vasogenic edema. This is the lesion identified on CT. This mass shows relatively solid enhancement. The enhancing component measures approximately 23 x 44 mm.  10 mm enhancing nodule in the right mid temporal lobe.  11 mm enhancing mass in the right medial temporal lobe.  5 x 10 mm enhancing lesion in the right optic radiation  5 x 9 mm enhancing lesion in the right medial parietal lobe  13 mm enhancing cortical lesion in the left parietal cortex.  5 mm enhancing lesion right parietal cortex over the convexity.  The  enhancing lesion show restricted diffusion. The large left basal ganglia lesion is iso to slightly hyper dense to cortex on CT. No necrosis identified.  Negative for intracranial hemorrhage. Ventricle size is normal. Mild midline shift to the right of approximately 5 mm.  IMPRESSION: Multiple enhancing mass lesions in the brain. The largest lesion is in the left basal ganglia. There is vasogenic edema around the lesions especially in the left basal ganglia. No significant central necrosis is seen in the lesions. 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 However the typical periventricular location of lesions is not a prominent feature in this case.  Differential diagnosis would include metastatic disease and multifocal GBM. Note is made of a left lung nodule which was not hypermetabolic on recent PET-CT. Infection not considered likely. Biopsy will be necessary for diagnosis.   Electronically Signed   By: Franchot Gallo M.D.   On: 12/08/2014 07:48   Ct Abdomen Pelvis W Contrast  12/08/2014   CLINICAL DATA:  Newly diagnosed brain mass, shortness of breath, weakness, history of asthma, GERD, hypertension, COPD, smoker  EXAM: CT CHEST, ABDOMEN, AND PELVIS WITH CONTRAST  TECHNIQUE: Multidetector CT imaging of the chest, abdomen and pelvis was performed following the standard protocol during bolus administration of intravenous contrast. Sagittal and coronal MPR images reconstructed from axial data set.  CONTRAST:  188mL OMNIPAQUE IOHEXOL 300 MG/ML SOLN IV. Dilute oral contrast.  COMPARISON:  PET-CT 09/21/2014  FINDINGS: CT CHEST FINDINGS  Scattered atherosclerotic calcifications aorta and coronary arteries.  No thoracic adenopathy.  Normal size precarinal lymph node 8 mm diameter image 19.  Dependent atelectasis in both lower lobes.  12 x 10 mm lingular nodule image 32, previously  13 x 11 mm, demonstrated no FDG avidity.  Tiny RIGHT middle lobe nodule 4 mm diameter image 32 unchanged.  Slight chronic accentuation of interstitial markings in the RIGHT upper lobe.  Remaining lungs clear without infiltrate, pleural effusion, pneumothorax or additional mass/ nodule.  Stable small sclerotic focus lateral RIGHT seventh rib in image 38.  No additional osseous findings.  CT ABDOMEN AND PELVIS FINDINGS  16 x 15 mm low-attenuation focus RIGHT lobe liver image 58 unchanged question small cyst.  Liver, gallbladder, spleen, pancreas, kidneys, and adrenal glands otherwise unremarkable.  Scattered atherosclerotic calcification without aneurysm.  Normal appendix.  Stomach and bowel loops grossly unremarkable for technique.  Unremarkable  bladder, ureters, uterus and adnexa.  BILATERAL inguinal and tiny umbilical hernias containing fat.  No mass, adenopathy, free fluid, free air, or acute osseous findings.  IMPRESSION: Stable 12 x 10 mm lingular and 4 mm RIGHT middle lobe nodules.  Bibasilar atelectasis.  Question small hepatic cyst.  BILATERAL inguinal and tiny umbilical hernias.  No other definite intrathoracic, intra-abdominal or intrapelvic abnormalities.   Electronically Signed   By: Lavonia Dana M.D.   On: 12/08/2014 21:58    Scheduled Meds: . cefTRIAXone (ROCEPHIN)  IV  1 g Intravenous Q24H  . cholecalciferol  2,000 Units Oral Daily  . dexamethasone  8 mg Oral 3 times per day  . enoxaparin (LOVENOX) injection  40 mg Subcutaneous QHS  . hydrochlorothiazide  12.5 mg Oral Daily  . losartan  100 mg Oral Daily  . mometasone-formoterol  2 puff Inhalation BID  . pantoprazole  40 mg Oral Daily  . rosuvastatin  10 mg Oral QPM   Continuous Infusions:    Time spent: > 35 minutes    Velvet Bathe  [facility] Pager [phone] If 7PM-7AM, please contact night-coverage at [email or web address], password [facility] 12/09/2014, 1:22 PM  LOS: 2 days

## 2014-12-09 NOTE — Consult Note (Signed)
Reason for Consult: Brain lesions Referring Physician: Dr. Rosemarie Ax Tanya King is an 64 y.o. female.  HPI: The patient is a 64 year old white female who has a history of a lung nodule. This was worked up in December with a PET scan which was negative for metabolic activity. The patient has had some mental status changes and right hemiparesis. She was brought to [facility] and workup with a head CT which demonstrated brain lesions. The patient was admitted for a metastatic workup. His CT of the chest, abdomen, and pelvis demonstrated no lesions worrisome for primary tumors. A brain MRI demonstrated multiple brain lesions. The patient has been seen by Dr. Marin Olp, oncology who has recommended a brain biopsy.  Presently the patient is accompanied by her son and husband. She denies headaches.  Past Medical History  Diagnosis Date  . Asthma   . Hypercholesteremia   . GERD (gastroesophageal reflux disease)   . Sleep apnea     AHI 116/hr now on BiPAP at 25/31mHg  . Edema   . HTN (hypertension)   . Allergic rhinitis   . Vitamin B12 deficiency   . RLS (restless legs syndrome)   . Vitamin deficiency   . COPD (chronic obstructive pulmonary disease)   . Obesity   . Aortic stenosis   . Edema     chronic LE edema  . Heart murmur     (mild AV stenosis with no regurg, mild MV sclerosis with mild MR, mod LVH 2012)  . Carotid artery occlusion     <30% bilateral ICA stenosis, no flow in left vertebral artery    Past Surgical History  Procedure Laterality Date  . Tubal ligation      Family History  Problem Relation Age of Onset  . Parkinson's disease Father   . Lung cancer Brother   . Diabetes Brother   . Heart attack Brother   . Diabetes Mother   . Hyperlipidemia Mother     Social History:  reports that she has been smoking Cigarettes.  She has a 42 pack-year smoking history. She does not have any smokeless tobacco history on file. She reports that she does not  drink alcohol or use illicit drugs.  Allergies:  Allergies  Allergen Reactions  . Lisinopril     Feels like she is burning up  . Shellfish Allergy Rash    Medications:  I have reviewed the patient's current medications. Prior to Admission:  Prescriptions prior to admission  Medication Sig Dispense Refill Last Dose  . albuterol (PROVENTIL HFA;VENTOLIN HFA) 108 (90 BASE) MCG/ACT inhaler Inhale 2 puffs into the lungs every 6 (six) hours as needed. wheezing   Past Week at Unknown time  . aspirin 325 MG tablet Take 325 mg by mouth daily.   12/07/2014 at Unknown time  . Cholecalciferol (VITAMIN D) 2000 UNITS tablet Take 2,000 Units by mouth daily.   12/07/2014 at Unknown time  . CRESTOR 10 MG tablet Take 1 tablet by mouth every evening.  0 Past Week at Unknown time  . fluticasone-salmeterol (ADVAIR HFA) 115-21 MCG/ACT inhaler Inhale 2 puffs into the lungs 2 (two) times daily.   12/07/2014 at Unknown time  . losartan-hydrochlorothiazide (HYZAAR) 100-12.5 MG per tablet Take 1 tablet by mouth daily.  0 12/07/2014 at Unknown time   Scheduled: . cefTRIAXone (ROCEPHIN)  IV  1 g Intravenous Q24H  . cholecalciferol  2,000 Units Oral Daily  . dexamethasone  8 mg Oral 3 times per day  .  enoxaparin (LOVENOX) injection  40 mg Subcutaneous QHS  . hydrochlorothiazide  12.5 mg Oral Daily  . losartan  100 mg Oral Daily  . mometasone-formoterol  2 puff Inhalation BID  . pantoprazole  40 mg Oral Daily  . rosuvastatin  10 mg Oral QPM   Continuous:  CXK:GYJEHUDJS, senna-docusate Anti-infectives    Start     Dose/Rate Route Frequency Ordered Stop   12/08/14 2200  cefTRIAXone (ROCEPHIN) 1 g in dextrose 5 % 50 mL IVPB - Premix     1 g 100 mL/hr over 30 Minutes Intravenous Every 24 hours 12/08/14 0001     12/07/14 2130  cefTRIAXone (ROCEPHIN) 1 g in dextrose 5 % 50 mL IVPB     1 g 100 mL/hr over 30 Minutes Intravenous  Once 12/07/14 2118 12/07/14 2208       Results for orders placed or performed during  the hospital encounter of 12/07/14 (from the past 48 hour(s))  CBC     Status: Abnormal   Collection Time: 12/07/14  7:59 PM  Result Value Ref Range   WBC 11.9 (H) 4.0 - 10.5 K/uL   RBC 5.07 3.87 - 5.11 MIL/uL   Hemoglobin 16.0 (H) 12.0 - 15.0 g/dL   HCT 46.6 (H) 36.0 - 46.0 %   MCV 91.9 78.0 - 100.0 fL   MCH 31.6 26.0 - 34.0 pg   MCHC 34.3 30.0 - 36.0 g/dL   RDW 13.9 11.5 - 15.5 %   Platelets 157 150 - 400 K/uL  Comprehensive metabolic panel     Status: Abnormal   Collection Time: 12/07/14  7:59 PM  Result Value Ref Range   Sodium 139 135 - 145 mmol/L   Potassium 3.8 3.5 - 5.1 mmol/L   Chloride 102 96 - 112 mmol/L   CO2 28 19 - 32 mmol/L   Glucose, Bld 93 70 - 99 mg/dL   BUN 11 6 - 23 mg/dL   Creatinine, Ser 0.87 0.50 - 1.10 mg/dL   Calcium 9.4 8.4 - 10.5 mg/dL   Total Protein 7.3 6.0 - 8.3 g/dL   Albumin 3.8 3.5 - 5.2 g/dL   AST 24 0 - 37 U/L   ALT 20 0 - 35 U/L   Alkaline Phosphatase 56 39 - 117 U/L   Total Bilirubin 0.7 0.3 - 1.2 mg/dL   GFR calc non Af Amer 70 (L) >90 mL/min   GFR calc Af Amer 82 (L) >90 mL/min    Comment: (NOTE) The eGFR has been calculated using the CKD EPI equation. This calculation has not been validated in all clinical situations. eGFR's persistently <90 mL/min signify possible Chronic Kidney Disease.    Anion gap 9 5 - 15  Troponin I     Status: None   Collection Time: 12/07/14  7:59 PM  Result Value Ref Range   Troponin I <0.03 <0.031 ng/mL    Comment:        NO INDICATION OF MYOCARDIAL INJURY.   CK     Status: Abnormal   Collection Time: 12/07/14  7:59 PM  Result Value Ref Range   Total CK 252 (H) 7 - 177 U/L  Urinalysis, Routine w reflex microscopic     Status: Abnormal   Collection Time: 12/07/14  8:30 PM  Result Value Ref Range   Color, Urine YELLOW YELLOW   APPearance CLOUDY (A) CLEAR   Specific Gravity, Urine 1.024 1.005 - 1.030   pH 5.5 5.0 - 8.0   Glucose, UA NEGATIVE NEGATIVE mg/dL  Hgb urine dipstick NEGATIVE NEGATIVE    Bilirubin Urine SMALL (A) NEGATIVE   Ketones, ur NEGATIVE NEGATIVE mg/dL   Protein, ur NEGATIVE NEGATIVE mg/dL   Urobilinogen, UA 1.0 0.0 - 1.0 mg/dL   Nitrite POSITIVE (A) NEGATIVE   Leukocytes, UA MODERATE (A) NEGATIVE  Urine microscopic-add on     Status: Abnormal   Collection Time: 12/07/14  8:30 PM  Result Value Ref Range   Squamous Epithelial / LPF FEW (A) RARE   WBC, UA 21-50 <3 WBC/hpf   Bacteria, UA MANY (A) RARE   Crystals CA OXALATE CRYSTALS (A) NEGATIVE  Urine culture     Status: None (Preliminary result)   Collection Time: 12/07/14  8:30 PM  Result Value Ref Range   Specimen Description URINE, CLEAN CATCH    Special Requests NONE    Colony Count      >=100,000 COLONIES/ML Performed at [place] Performed at [facility]    Report Status PENDING   Comprehensive metabolic panel     Status: Abnormal   Collection Time: 12/08/14  4:14 AM  Result Value Ref Range   Sodium 138 135 - 145 mmol/L   Potassium 4.3 3.5 - 5.1 mmol/L   Chloride 104 96 - 112 mmol/L   CO2 26 19 - 32 mmol/L   Glucose, Bld 156 (H) 70 - 99 mg/dL   BUN 11 6 - 23 mg/dL   Creatinine, Ser 0.73 0.50 - 1.10 mg/dL   Calcium 9.2 8.4 - 10.5 mg/dL   Total Protein 6.9 6.0 - 8.3 g/dL   Albumin 3.6 3.5 - 5.2 g/dL   AST 19 0 - 37 U/L   ALT 19 0 - 35 U/L   Alkaline Phosphatase 51 39 - 117 U/L   Total Bilirubin 0.6 0.3 - 1.2 mg/dL   GFR calc non Af Amer >90 >90 mL/min   GFR calc Af Amer >90 >90 mL/min    Comment: (NOTE) The eGFR has been calculated using the CKD EPI equation. This calculation has not been validated in all clinical situations. eGFR's persistently <90 mL/min signify possible Chronic Kidney Disease.    Anion gap 8 5 - 15  CBC with Differential/Platelet     Status: Abnormal   Collection Time: 12/08/14  4:14 AM  Result Value Ref Range   WBC 9.6 4.0 - 10.5 K/uL   RBC 5.07 3.87 - 5.11 MIL/uL   Hemoglobin 15.9 (H) 12.0 - 15.0 g/dL    HCT 46.8 (H) 36.0 - 46.0 %   MCV 92.3 78.0 - 100.0 fL   MCH 31.4 26.0 - 34.0 pg   MCHC 34.0 30.0 - 36.0 g/dL   RDW 13.9 11.5 - 15.5 %   Platelets 163 150 - 400 K/uL   Neutrophils Relative % 94 (H) 43 - 77 %   Neutro Abs 9.1 (H) 1.7 - 7.7 K/uL   Lymphocytes Relative 5 (L) 12 - 46 %   Lymphs Abs 0.4 (L) 0.7 - 4.0 K/uL   Monocytes Relative 1 (L) 3 - 12 %   Monocytes Absolute 0.1 0.1 - 1.0 K/uL   Eosinophils Relative 0 0 - 5 %   Eosinophils Absolute 0.0 0.0 - 0.7 K/uL   Basophils Relative 0 0 - 1 %   Basophils Absolute 0.0 0.0 - 0.1 K/uL  Lactate dehydrogenase     Status: None   Collection Time: 12/09/14  4:13 AM  Result Value Ref Range  LDH 174 94 - 250 U/L    Dg Chest 2 View  12/07/2014   CLINICAL DATA:  Altered mental status.  Altered speech.  EXAM: CHEST  2 VIEW  COMPARISON:  CT chest 09/14/2014.  PA and lateral chest 09/09/2014.  FINDINGS: The chest is hyperexpanded with peribronchial thickening. No consolidative process, pneumothorax or effusion. Heart size is normal.  IMPRESSION: COPD without acute disease.   Electronically Signed   By: Inge Rise M.D.   On: 12/07/2014 22:28   Ct Head Wo Contrast  12/07/2014   CLINICAL DATA:  Progressive weakness and slurred speech.  EXAM: CT HEAD WITHOUT CONTRAST  TECHNIQUE: Contiguous axial images were obtained from the base of the skull through the vertex without intravenous contrast.  COMPARISON:  None.  FINDINGS: A single mass lesion versus two contiguous lesions is identified in the left cerebral hemisphere centered in the basal ganglia. Assuming one lesion, it measures 2.8 x 4.3 cm. There is surrounding vasogenic edema. Mass effect is present on the anterior horn of the left lateral ventricle and mild left-to-right shift of 0.5 cm. No other mass is identified. There is no hemorrhage, acute infarction or abnormal extra-axial fluid collection. The calvarium is intact.  IMPRESSION: The study is positive for a single large mass versus two  contiguous masses centered in the left basal ganglia. MRI of the brain with and without contrast is recommended for further evaluation.   Electronically Signed   By: Inge Rise M.D.   On: 12/07/2014 21:43   Ct Chest W Contrast  12/08/2014   CLINICAL DATA:  Newly diagnosed brain mass, shortness of breath, weakness, history of asthma, GERD, hypertension, COPD, smoker  EXAM: CT CHEST, ABDOMEN, AND PELVIS WITH CONTRAST  TECHNIQUE: Multidetector CT imaging of the chest, abdomen and pelvis was performed following the standard protocol during bolus administration of intravenous contrast. Sagittal and coronal MPR images reconstructed from axial data set.  CONTRAST:  17m OMNIPAQUE IOHEXOL 300 MG/ML SOLN IV. Dilute oral contrast.  COMPARISON:  PET-CT 09/21/2014  FINDINGS: CT CHEST FINDINGS  Scattered atherosclerotic calcifications aorta and coronary arteries.  No thoracic adenopathy.  Normal size precarinal lymph node 8 mm diameter image 19.  Dependent atelectasis in both lower lobes.  12 x 10 mm lingular nodule image 32, previously 13 x 11 mm, demonstrated no FDG avidity.  Tiny RIGHT middle lobe nodule 4 mm diameter image 32 unchanged.  Slight chronic accentuation of interstitial markings in the RIGHT upper lobe.  Remaining lungs clear without infiltrate, pleural effusion, pneumothorax or additional mass/ nodule.  Stable small sclerotic focus lateral RIGHT seventh rib in image 38.  No additional osseous findings.  CT ABDOMEN AND PELVIS FINDINGS  16 x 15 mm low-attenuation focus RIGHT lobe liver image 58 unchanged question small cyst.  Liver, gallbladder, spleen, pancreas, kidneys, and adrenal glands otherwise unremarkable.  Scattered atherosclerotic calcification without aneurysm.  Normal appendix.  Stomach and bowel loops grossly unremarkable for technique.  Unremarkable bladder, ureters, uterus and adnexa.  BILATERAL inguinal and tiny umbilical hernias containing fat.  No mass, adenopathy, free fluid, free air,  or acute osseous findings.  IMPRESSION: Stable 12 x 10 mm lingular and 4 mm RIGHT middle lobe nodules.  Bibasilar atelectasis.  Question small hepatic cyst.  BILATERAL inguinal and tiny umbilical hernias.  No other definite intrathoracic, intra-abdominal or intrapelvic abnormalities.   Electronically Signed   By: MLavonia DanaM.D.   On: 12/08/2014 21:58   Mr BJeri CosWHOContrast  12/08/2014  CLINICAL DATA:  Brain mass  EXAM: MRI HEAD WITHOUT AND WITH CONTRAST  TECHNIQUE: Multiplanar, multiecho pulse sequences of the brain and surrounding structures were obtained without and with intravenous contrast.  CONTRAST:  38m MULTIHANCE GADOBENATE DIMEGLUMINE 529 MG/ML IV SOLN  COMPARISON:  CT head 12/07/2014  FINDINGS: Image quality degraded by moderate motion. The patient did not hold still which became progressive during the exam.  Multiple masses are present in the brain. The largest mass is centered in the left basal ganglia and has a moderate amount of surrounding vasogenic edema. This is the lesion identified on CT. This mass shows relatively solid enhancement. The enhancing component measures approximately 23 x 44 mm.  10 mm enhancing nodule in the right mid temporal lobe.  11 mm enhancing mass in the right medial temporal lobe.  5 x 10 mm enhancing lesion in the right optic radiation  5 x 9 mm enhancing lesion in the right medial parietal lobe  13 mm enhancing cortical lesion in the left parietal cortex.  5 mm enhancing lesion right parietal cortex over the convexity.  The enhancing lesion show restricted diffusion. The large left basal ganglia lesion is iso to slightly hyper dense to cortex on CT. No necrosis identified.  Negative for intracranial hemorrhage. Ventricle size is normal. Mild midline shift to the right of approximately 5 mm.  IMPRESSION: Multiple enhancing mass lesions in the brain. The largest lesion is in the left basal ganglia. There is vasogenic edema around the lesions especially in the left  basal ganglia. No significant central necrosis is seen in the lesions. 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 However the typical periventricular location of lesions is not a prominent feature in this case.  Differential diagnosis would include metastatic disease and multifocal GBM. Note is made of a left lung nodule which was not hypermetabolic on recent PET-CT. Infection not considered likely. Biopsy will be necessary for diagnosis.   Electronically Signed   By: CFranchot GalloM.D.   On: 12/08/2014 07:48   Ct Abdomen Pelvis W Contrast  12/08/2014   CLINICAL DATA:  Newly diagnosed brain mass, shortness of breath, weakness, history of asthma, GERD, hypertension, COPD, smoker  EXAM: CT CHEST, ABDOMEN, AND PELVIS WITH CONTRAST  TECHNIQUE: Multidetector CT imaging of the chest, abdomen and pelvis was performed following the standard protocol during bolus administration of intravenous contrast. Sagittal and coronal MPR images reconstructed from axial data set.  CONTRAST:  1010mOMNIPAQUE IOHEXOL 300 MG/ML SOLN IV. Dilute oral contrast.  COMPARISON:  PET-CT 09/21/2014  FINDINGS: CT CHEST FINDINGS  Scattered atherosclerotic calcifications aorta and coronary arteries.  No thoracic adenopathy.  Normal size precarinal lymph node 8 mm diameter image 19.  Dependent atelectasis in both lower lobes.  12 x 10 mm lingular nodule image 32, previously 13 x 11 mm, demonstrated no FDG avidity.  Tiny RIGHT middle lobe nodule 4 mm diameter image 32 unchanged.  Slight chronic accentuation of interstitial markings in the RIGHT upper lobe.  Remaining lungs clear without infiltrate, pleural effusion, pneumothorax or additional mass/ nodule.  Stable small sclerotic focus lateral RIGHT seventh rib in image 38.  No additional osseous findings.  CT ABDOMEN AND PELVIS FINDINGS  16 x 15 mm low-attenuation  focus RIGHT lobe liver image 58 unchanged question small cyst.  Liver, gallbladder, spleen, pancreas, kidneys, and adrenal glands otherwise unremarkable.  Scattered atherosclerotic calcification without aneurysm.  Normal appendix.  Stomach and bowel loops grossly unremarkable for technique.  Unremarkable bladder, ureters, uterus and adnexa.  BILATERAL inguinal and tiny umbilical hernias containing fat.  No mass, adenopathy, free fluid, free air, or acute osseous findings.  IMPRESSION: Stable 12 x 10 mm lingular and 4 mm RIGHT middle lobe nodules.  Bibasilar atelectasis.  Question small hepatic cyst.  BILATERAL inguinal and tiny umbilical hernias.  No other definite intrathoracic, intra-abdominal or intrapelvic abnormalities.   Electronically Signed   By: Lavonia Dana M.D.   On: 12/08/2014 21:58    ROS Blood pressure 162/56, pulse 67, temperature 97.6 F (36.4 C), temperature source Oral, resp. rate 18, height 5' 6"  (1.676 m), weight 120.9 kg (266 lb 8.6 oz), SpO2 93 %. Physical Exam General: A pleasant, obese, 64 year old white female in no apparent distress.  HEENT: Normocephalic, atraumatic, pupils equal round reactive light, extraocular muscles are intact  Neck: Supple without masses or deformities. She has a normal range of motion.  Thorax: Symmetric  Abdomen: Soft  Extremities: No obvious abnormalities  Neurologic exam: The patient is alert and oriented 2, person and [facility]. She could not tell me the month or the year. Glasgow Coma Scale 14. Cranial nerves II through XII were examined bilaterally and grossly normal. Vision and hearing are grossly normal bilaterally. Motor strength is 5 over 5 in her left hand grip, bicep, tricep, quadriceps, gastrocnemius. She has a right hemiparesis with weakness most prominent in her right hand grip at 4 over 5. Her mentation is a bit slow. Her speech is grossly normal. Cerebellar function is intact to rapid alternating movements of the left  upper extremity. She has some right hand apraxia. Sensation is grossly normal to light touch sensation all tested dermatomes bilaterally.  I have reviewed the patient's brain MRI performed at [facility] on 12/08/2014. She has multiple intracranial lesions. Assessment/Plan: Multiple brain lesions: I have discussed the situation with the patient, her husband, and her son. I reviewed the MRI scan with the patient's husband and son. We have discussed the various possibilities. We have discussed primary versus metastatic brain tumors. I think this is most likely the latter. 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 We have discussed the various treatment options including doing nothing, empiric treatment, and stereotactic brain biopsy. I have described the surgery to them. We have discussed the risks of surgery including risks of anesthesia, hemorrhage, infection, seizures, inability to make a diagnosis, medical risk, etc. They understand the purpose of the biopsies to make a diagnosis and hope assist in further treatment of the tumor and that the purpose of the biopsy is not to remove the tumors. I have answered all their questions. They want to proceed with surgery. We have the surgery tentatively planned for Monday. From my point of view is okay for the patient to be discharged and return to [facility] on Monday morning. I have instructed them to stop her aspirin and have her nothing by mouth after 5 PM on Monday morning. She should be discharged with Decadron. I don't think she needs anticonvulsants presently. I have put a page in for Dr. Wendee Beavers.  Randeep Biondolillo D 12/09/2014, 6:22 PM

## 2014-12-09 NOTE — Evaluation (Signed)
Physical Therapy Evaluation Patient Details Name: Tanya King MRN: [id number] DOB: Sep 06, 1951 Today's Date: 12/09/2014   History of Present Illness  Tanya King is a 64 y.o. female with known history of hypertension, hyperlipidemia, probable COPD, tobacco abuse presents to the ER because of increasing difficulty walking due to balance issues and also patient's family found the patient has been having some weakness on the right and lower extremities. Patient also was found to have some slurred speech. Patient's symptoms have been ongoing for last 2-3 weeks. Patient did not have any loss of consciousness blurred vision headache incontinence of urine or bowel. In the ER patient had a CT head which shows brain mass.   Clinical Impression  *Pt admitted with above diagnosis. Pt currently with functional limitations due to the deficits listed below (see PT Problem List). ** Pt will benefit from skilled PT to increase their independence and safety with mobility to allow discharge to the venue listed below.    Pt ambulated 300' with RW with supervision. Primary deficit seems to be with R hand coordination/strength. Instructed pt/family in grip strengthening exercise and in fine motor exercise. OT to evaluate pt later today. Pt would benefit from outpt OT.  **    Follow Up Recommendations Supervision for mobility/OOB    Equipment Recommendations  None recommended by PT    Recommendations for Other Services       Precautions / Restrictions Precautions Precautions: Fall Restrictions Weight Bearing Restrictions: No      Mobility  Bed Mobility Overal bed mobility: Modified Independent                Transfers Overall transfer level: Needs assistance Equipment used: Rolling walker (2 wheeled) Transfers: Sit to/from Stand Sit to Stand: Supervision         General transfer comment: cues for hand placement  Ambulation/Gait Ambulation/Gait assistance:  Supervision Ambulation Distance (Feet): 300 Feet Assistive device: Rolling walker (2 wheeled) Gait Pattern/deviations: Step-through pattern   Gait velocity interpretation: Below normal speed for age/gender General Gait Details: steady with RW, pt walked 50' without RW but tended to reach for handrail on wall; 2/4 dyspnea with walking, SaO2 89% on RA, family states SOB is baseline, applied 2L O2, sats up to 97%  Stairs            Wheelchair Mobility    Modified Rankin (Stroke Patients Only)       Balance Overall balance assessment: Modified Independent                                           Pertinent Vitals/Pain Pain Assessment: No/denies pain    Home Living Family/patient expects to be discharged to:: Private residence Living Arrangements: Spouse/significant other Available Help at Discharge: Family;Available 24 hours/day Type of Home: House Home Access: Stairs to enter Entrance Stairs-Rails: Left Entrance Stairs-Number of Steps: 4 Home Layout: One level Home Equipment: Walker - 2 wheels;Walker - 4 wheels;Bedside commode;Cane - single point      Prior Function Level of Independence: Independent         Comments: was starting to shuffle feet at home, no falls per family     Hand Dominance        Extremity/Trunk Assessment   Upper Extremity Assessment: RUE deficits/detail RUE Deficits / Details: grip -4/5, weaker than L   RUE Sensation: decreased light touch  Lower Extremity Assessment: Overall WFL for tasks assessed      Cervical / Trunk Assessment: Normal  Communication      Cognition Arousal/Alertness: Awake/alert Behavior During Therapy: WFL for tasks assessed/performed Overall Cognitive Status: Impaired/Different from baseline Area of Impairment: Memory     Memory: Decreased short-term memory              General Comments      Exercises Other Exercises Other Exercises: R Gripping towel roll Other  Exercises: R Thumb to 2nd-5th finger tips AROM      Assessment/Plan    PT Assessment Patient needs continued PT services  PT Diagnosis Altered mental status;Hemiplegia dominant side   PT Problem List Decreased strength;Decreased activity tolerance;Decreased mobility;Impaired sensation  PT Treatment Interventions DME instruction;Gait training;Stair training;Functional mobility training;Therapeutic activities;Therapeutic exercise;Patient/family education   PT Goals (Current goals can be found in the Care Plan section) Acute Rehab PT Goals Patient Stated Goal: improve R hand function PT Goal Formulation: With patient/family Time For Goal Achievement: 12/23/14 Potential to Achieve Goals: Good    Frequency Min 3X/week   Barriers to discharge        Co-evaluation               End of Session Equipment Utilized During Treatment: Gait belt Activity Tolerance: Patient tolerated treatment well Patient left: in chair;with call bell/phone within reach;with chair alarm set;with family/visitor present Nurse Communication: Mobility status         Time: 1610-9604 PT Time Calculation (min) (ACUTE ONLY): 38 min   Charges:   PT Evaluation $Initial PT Evaluation Tier I: 1 Procedure PT Treatments $Gait Training: 8-22 mins $Therapeutic Exercise: 8-22 mins   PT G Codes:        Philomena Doheny 12/09/2014, 10:16 AM [phone]

## 2014-12-09 NOTE — Progress Notes (Signed)
Pt has refused CPAP for the night, pt stated that she was going home in the am and didn't want to use machine.  RT to monitor and assess as needed.

## 2014-12-10 DIAGNOSIS — E785 Hyperlipidemia, unspecified: Secondary | ICD-10-CM

## 2014-12-10 LAB — LACTATE DEHYDROGENASE: LDH: 182 U/L (ref 94–250)

## 2014-12-10 LAB — URINE CULTURE: Colony Count: >=100000

## 2014-12-10 MED ORDER — DEXAMETHASONE 4 MG PO TABS: 8.0000 mg | ORAL_TABLET | Freq: Three times a day (TID) | ORAL | Status: DC

## 2014-12-10 NOTE — Progress Notes (Signed)
Discharge instructions reviewed with patient and her husband using teach back method.  Patient's husband demonstrated understanding.  Patient sleepy and does not have ability to understand instructions fully at this time.  Patient stable for discharge home.  Assessment unchanged from this am.  Patient's husband understands to follow up on Monday at [facility] for biopsy.  Zandra Abts [facility]  12/10/2014

## 2014-12-10 NOTE — Progress Notes (Signed)
Mrs. Hurley was seen yesterday by Dr. Arnoldo Morale of neurosurgery. She will be going for a brain biopsy on Monday. She will be discharged today so she can enjoy the weekend.  I still feel that we are looking at metastatic disease. 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 She has this nodule. Even though it is not PET avid, this would be the most likely possibility.  Melanoma also would be a possibility.  CNS lymphoma is another consideration.  She's doing well this morning. She did well yesterday. She slept well.  All of her vital signs are stable. There is what no change on her physical exam.  I will plan to see her over at [facility] next week. Hopefully, if she has her biopsy on Monday, the path report will be out by Wednesday.  I will speak with radiation oncology next week.  As always, we had an excellent prayer session.   Pete E.  Joshua 1:9

## 2014-12-10 NOTE — Discharge Summary (Signed)
Physician Discharge Summary  Lexys Milliner MOQ:[id number] DOB: 1950/10/30 DOA: 12/07/2014  PCP: Lilian Coma, MD  Admit date: 12/07/2014 Discharge date: 12/10/2014  Time spent: > 35 minutes  Recommendations for Outpatient Follow-up:  1. Patient is to f/u Monday 12/14/2014 at the hospital for follow up with Neurosurgeon for biopsy of brain lesions to get pathological diagnosis 2. Oncologist aware of plan and will follow  Discharge Diagnoses:  Active Problems:   Brain mass   Essential hypertension   Hyperlipidemia   OSA (obstructive sleep apnea)   Acute UTI (urinary tract infection)   Discharge Condition: stable  Diet recommendation: General diet  Filed Weights   12/07/14 2319 12/07/14 2321  Weight: 120.9 kg (266 lb 8.6 oz) 120.9 kg (266 lb 8.6 oz)    History of present illness:  From original HPI: 64 y.o. female with known history of hypertension, hyperlipidemia, probable COPD, tobacco abuse presents to the ER because of increasing difficulty walking due to balance issues and also patient's family found the patient has been having some weakness on the right and lower extremities. Patient also was found to have some slurred speech. Patient's symptoms have been ongoing for last 2-3 weeks. Patient did not have any loss of consciousness blurred vision headache incontinence of urine or bowel. In the ER patient had a CT head which shows brain mass.   Hospital Course:  Brain mass - s/p MRI of brain, discussed results with family - consulted heme/onc who currently recommends biopsy of lesions located in brain. Consulted Neurosurgeon who is currently planning on biopsy for Monday. Plan will be to d/c patient home over the weekend (per their preference) to f/u on Monday for procedure. - Will continue decadron on discharge   Essential hypertension - Pt is on losartan and hydrochlorothiazide, will continue on discharge   Hyperlipidemia - stable, patient is on crestor will continue  on discharge   Acute UTI (urinary tract infection) - The patient has received 3 days of Rocephin which suffices for uncomplicated UTI - Urine culture growing more than 100,000 colony-forming units of Escherichia coli   Procedures:  None  Consultations:  Neurosurgery : Dr. Arnoldo Morale   oncology: Dr. Marin Olp  Discharge Exam: Filed Vitals:   12/10/14 0530  BP: 138/81  Pulse: 62  Temp: 97.7 F (36.5 C)  Resp: 18    General: Pt in nad, alert and awake Cardiovascular: rrr, no mrg Respiratory: cta bl, no wheezes  Discharge Instructions   Discharge Instructions    Call MD for:  extreme fatigue    Complete by:  As directed      Call MD for:  persistant nausea and vomiting    Complete by:  As directed      Call MD for:  severe uncontrolled pain    Complete by:  As directed      Call MD for:  temperature >100.4    Complete by:  As directed      Diet - low sodium heart healthy    Complete by:  As directed      Discharge instructions    Complete by:  As directed   Please follow up on Monday 11/25/2014 with your neurosurgeon for biopsy at [facility].  Do not take any aspirin     Increase activity slowly    Complete by:  As directed           Current Discharge Medication List    START taking these medications   Details  dexamethasone (DECADRON)  4 MG tablet Take 2 tablets (8 mg total) by mouth every 8 (eight) hours. Qty: 30 tablet, Refills: 0   Associated Diagnoses: Brain mass      CONTINUE these medications which have NOT CHANGED   Details  albuterol (PROVENTIL HFA;VENTOLIN HFA) 108 (90 BASE) MCG/ACT inhaler Inhale 2 puffs into the lungs every 6 (six) hours as needed. wheezing    Cholecalciferol (VITAMIN D) 2000 UNITS tablet Take 2,000 Units by mouth daily.    CRESTOR 10 MG tablet Take 1 tablet by mouth every evening. Refills: 0    fluticasone-salmeterol (ADVAIR HFA) 115-21 MCG/ACT inhaler Inhale 2 puffs into the lungs 2 (two) times daily.     losartan-hydrochlorothiazide (HYZAAR) 100-12.5 MG per tablet Take 1 tablet by mouth daily. Refills: 0      STOP taking these medications     aspirin 325 MG tablet        Allergies  Allergen Reactions  . Lisinopril     Feels like she is burning up  . Shellfish Allergy Rash      The results of significant diagnostics from this hospitalization (including imaging, microbiology, ancillary and laboratory) are listed below for reference.    Significant Diagnostic Studies: Dg Chest 2 View  12/07/2014   CLINICAL DATA:  Altered mental status.  Altered speech.  EXAM: CHEST  2 VIEW  COMPARISON:  CT chest 09/14/2014.  PA and lateral chest 09/09/2014.  FINDINGS: The chest is hyperexpanded with peribronchial thickening. No consolidative process, pneumothorax or effusion. Heart size is normal.  IMPRESSION: COPD without acute disease.   Electronically Signed   By: Inge Rise M.D.   On: 12/07/2014 22:28   Ct Head Wo Contrast  12/07/2014   CLINICAL DATA:  Progressive weakness and slurred speech.  EXAM: CT HEAD WITHOUT CONTRAST  TECHNIQUE: Contiguous axial images were obtained from the base of the skull through the vertex without intravenous contrast.  COMPARISON:  None.  FINDINGS: A single mass lesion versus two contiguous lesions is identified in the left cerebral hemisphere centered in the basal ganglia. Assuming one lesion, it measures 2.8 x 4.3 cm. There is surrounding vasogenic edema. Mass effect is present on the anterior horn of the left lateral ventricle and mild left-to-right shift of 0.5 cm. No other mass is identified. There is no hemorrhage, acute infarction or abnormal extra-axial fluid collection. The calvarium is intact.  IMPRESSION: The study is positive for a single large mass versus two contiguous masses centered in the left basal ganglia. MRI of the brain with and without contrast is recommended for further evaluation.   Electronically Signed   By: Inge Rise M.D.   On:  12/07/2014 21:43   Ct Chest W Contrast  12/08/2014   CLINICAL DATA:  Newly diagnosed brain mass, shortness of breath, weakness, history of asthma, GERD, hypertension, COPD, smoker  EXAM: CT CHEST, ABDOMEN, AND PELVIS WITH CONTRAST  TECHNIQUE: Multidetector CT imaging of the chest, abdomen and pelvis was performed following the standard protocol during bolus administration of intravenous contrast. Sagittal and coronal MPR images reconstructed from axial data set.  CONTRAST:  121mL OMNIPAQUE IOHEXOL 300 MG/ML SOLN IV. Dilute oral contrast.  COMPARISON:  PET-CT 09/21/2014  FINDINGS: CT CHEST FINDINGS  Scattered atherosclerotic calcifications aorta and coronary arteries.  No thoracic adenopathy.  Normal size precarinal lymph node 8 mm diameter image 19.  Dependent atelectasis in both lower lobes.  12 x 10 mm lingular nodule image 32, previously 13 x 11 mm, demonstrated no FDG avidity.  Tiny RIGHT middle lobe nodule 4 mm diameter image 32 unchanged.  Slight chronic accentuation of interstitial markings in the RIGHT upper lobe.  Remaining lungs clear without infiltrate, pleural effusion, pneumothorax or additional mass/ nodule.  Stable small sclerotic focus lateral RIGHT seventh rib in image 38.  No additional osseous findings.  CT ABDOMEN AND PELVIS FINDINGS  16 x 15 mm low-attenuation focus RIGHT lobe liver image 58 unchanged question small cyst.  Liver, gallbladder, spleen, pancreas, kidneys, and adrenal glands otherwise unremarkable.  Scattered atherosclerotic calcification without aneurysm.  Normal appendix.  Stomach and bowel loops grossly unremarkable for technique.  Unremarkable bladder, ureters, uterus and adnexa.  BILATERAL inguinal and tiny umbilical hernias containing fat.  No mass, adenopathy, free fluid, free air, or acute osseous findings.  IMPRESSION: Stable 12 x 10 mm lingular and 4 mm RIGHT middle lobe nodules.  Bibasilar atelectasis.  Question small hepatic cyst.  BILATERAL inguinal and tiny  umbilical hernias.  No other definite intrathoracic, intra-abdominal or intrapelvic abnormalities.   Electronically Signed   By: Lavonia Dana M.D.   On: 12/08/2014 21:58   Mr Jeri Cos QM Contrast  12/08/2014   CLINICAL DATA:  Brain mass  EXAM: MRI HEAD WITHOUT AND WITH CONTRAST  TECHNIQUE: Multiplanar, multiecho pulse sequences of the brain and surrounding structures were obtained without and with intravenous contrast.  CONTRAST:  75mL MULTIHANCE GADOBENATE DIMEGLUMINE 529 MG/ML IV SOLN  COMPARISON:  CT head 12/07/2014  FINDINGS: Image quality degraded by moderate motion. The patient did not hold still which became progressive during the exam.  Multiple masses are present in the brain. The largest mass is centered in the left basal ganglia and has a moderate amount of surrounding vasogenic edema. This is the lesion identified on CT. This mass shows relatively solid enhancement. The enhancing component measures approximately 23 x 44 mm.  10 mm enhancing nodule in the right mid temporal lobe.  11 mm enhancing mass in the right medial temporal lobe.  5 x 10 mm enhancing lesion in the right optic radiation  5 x 9 mm enhancing lesion in the right medial parietal lobe  13 mm enhancing cortical lesion in the left parietal cortex.  5 mm enhancing lesion right parietal cortex over the convexity.  The enhancing lesion show restricted diffusion. The large left basal ganglia lesion is iso to slightly hyper dense to cortex on CT. No necrosis identified.  Negative for intracranial hemorrhage. Ventricle size is normal. Mild midline shift to the right of approximately 5 mm.  IMPRESSION: Multiple enhancing mass lesions in the brain. The largest lesion is in the left basal ganglia. There is vasogenic edema around the lesions especially in the left basal ganglia. No significant central necrosis is seen in the lesions. 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 However the typical periventricular location of lesions is not a prominent feature in this case.  Differential diagnosis would include metastatic disease and multifocal GBM. Note is made of a left lung nodule which was not hypermetabolic on recent PET-CT. Infection not considered likely. Biopsy will be necessary for diagnosis.   Electronically Signed   By: Franchot Gallo M.D.   On: 12/08/2014 07:48   Ct Abdomen Pelvis W Contrast  12/08/2014   CLINICAL DATA:  Newly diagnosed brain mass, shortness of breath, weakness, history of asthma, GERD, hypertension, COPD, smoker  EXAM:  CT CHEST, ABDOMEN, AND PELVIS WITH CONTRAST  TECHNIQUE: Multidetector CT imaging of the chest, abdomen and pelvis was performed following the standard protocol during bolus administration of intravenous contrast. Sagittal and coronal MPR images reconstructed from axial data set.  CONTRAST:  164mL OMNIPAQUE IOHEXOL 300 MG/ML SOLN IV. Dilute oral contrast.  COMPARISON:  PET-CT 09/21/2014  FINDINGS: CT CHEST FINDINGS  Scattered atherosclerotic calcifications aorta and coronary arteries.  No thoracic adenopathy.  Normal size precarinal lymph node 8 mm diameter image 19.  Dependent atelectasis in both lower lobes.  12 x 10 mm lingular nodule image 32, previously 13 x 11 mm, demonstrated no FDG avidity.  Tiny RIGHT middle lobe nodule 4 mm diameter image 32 unchanged.  Slight chronic accentuation of interstitial markings in the RIGHT upper lobe.  Remaining lungs clear without infiltrate, pleural effusion, pneumothorax or additional mass/ nodule.  Stable small sclerotic focus lateral RIGHT seventh rib in image 38.  No additional osseous findings.  CT ABDOMEN AND PELVIS FINDINGS  16 x 15 mm low-attenuation focus RIGHT lobe liver image 58 unchanged question small cyst.  Liver, gallbladder, spleen, pancreas, kidneys, and adrenal glands otherwise unremarkable.  Scattered atherosclerotic  calcification without aneurysm.  Normal appendix.  Stomach and bowel loops grossly unremarkable for technique.  Unremarkable bladder, ureters, uterus and adnexa.  BILATERAL inguinal and tiny umbilical hernias containing fat.  No mass, adenopathy, free fluid, free air, or acute osseous findings.  IMPRESSION: Stable 12 x 10 mm lingular and 4 mm RIGHT middle lobe nodules.  Bibasilar atelectasis.  Question small hepatic cyst.  BILATERAL inguinal and tiny umbilical hernias.  No other definite intrathoracic, intra-abdominal or intrapelvic abnormalities.   Electronically Signed   By: Lavonia Dana M.D.   On: 12/08/2014 21:58    Microbiology: Recent Results (from the past 240 hour(s))  Urine culture     Status: None (Preliminary result)   Collection Time: 12/07/14  8:30 PM  Result Value Ref Range Status   Specimen Description URINE, CLEAN CATCH  Final   Special Requests NONE  Final   Colony Count   Final    >=100,000 COLONIES/ML Performed at [facility]    Culture   Final    ESCHERICHIA COLI Performed at [facility]    Report Status PENDING  Incomplete     Labs: Basic Metabolic Panel:  Recent Labs Lab 12/07/14 1959 12/08/14 0414  NA 139 138  K 3.8 4.3  CL 102 104  CO2 28 26  GLUCOSE 93 156*  BUN 11 11  CREATININE 0.87 0.73  CALCIUM 9.4 9.2   Liver Function Tests:  Recent Labs Lab 12/07/14 1959 12/08/14 0414  AST 24 19  ALT 20 19  ALKPHOS 56 51  BILITOT 0.7 0.6  PROT 7.3 6.9  ALBUMIN 3.8 3.6   No results for input(s): LIPASE, AMYLASE in the last 168 hours. No results for input(s): AMMONIA in the last 168 hours. CBC:  Recent Labs Lab 12/07/14 1959 12/08/14 0414  WBC 11.9* 9.6  NEUTROABS  --  9.1*  HGB 16.0* 15.9*  HCT 46.6* 46.8*  MCV 91.9 92.3  PLT 157 163   Cardiac Enzymes:  Recent Labs Lab 12/07/14 1959  CKTOTAL 60*  TROPONINI <0.03   BNP: BNP (last 3 results) No results for input(s): BNP in the last 8760 hours.  ProBNP (last 3  results) No results for input(s): PROBNP in the last 8760 hours.  CBG: No results for input(s): GLUCAP in the last 168 hours.  Signed:  Velvet Bathe  [facility] 12/10/2014, 8:42 AM

## 2014-12-12 ENCOUNTER — Ambulatory Visit (HOSPITAL_COMMUNITY)
Admit: 2014-12-12 | Discharge: 2014-12-12 | Disposition: A | Discharge status: Home / Self Care | Payer: 59 | Source: Ambulatory Visit | Attending: Neurosurgery | Admitting: Neurosurgery

## 2014-12-12 ENCOUNTER — Inpatient Hospital Stay (HOSPITAL_COMMUNITY): Payer: 59 | Admitting: Anesthesiology

## 2014-12-12 ENCOUNTER — Other Ambulatory Visit (HOSPITAL_COMMUNITY): Payer: Self-pay | Admitting: Neurosurgery

## 2014-12-12 ENCOUNTER — Inpatient Hospital Stay (HOSPITAL_COMMUNITY)
Admission: EM | Admit: 2014-12-12 | Discharge: 2015-01-20 | DRG: 840 | Disposition: E | Payer: 59 | Source: Ambulatory Visit | Attending: Pulmonary Disease | Admitting: Pulmonary Disease

## 2014-12-12 ENCOUNTER — Encounter (HOSPITAL_COMMUNITY): Payer: Self-pay | Admitting: *Deleted

## 2014-12-12 ENCOUNTER — Encounter (HOSPITAL_COMMUNITY): Admission: EM | Disposition: E | Payer: Self-pay | Source: Ambulatory Visit | Attending: Hematology & Oncology

## 2014-12-12 DIAGNOSIS — B9620 Unspecified Escherichia coli [E. coli] as the cause of diseases classified elsewhere: Secondary | ICD-10-CM | POA: Diagnosis present

## 2014-12-12 DIAGNOSIS — F17210 Nicotine dependence, cigarettes, uncomplicated: Secondary | ICD-10-CM | POA: Diagnosis present

## 2014-12-12 DIAGNOSIS — Z01818 Encounter for other preprocedural examination: Secondary | ICD-10-CM | POA: Insufficient documentation

## 2014-12-12 DIAGNOSIS — D696 Thrombocytopenia, unspecified: Secondary | ICD-10-CM | POA: Diagnosis present

## 2014-12-12 DIAGNOSIS — I248 Other forms of acute ischemic heart disease: Secondary | ICD-10-CM | POA: Diagnosis present

## 2014-12-12 DIAGNOSIS — G8191 Hemiplegia, unspecified affecting right dominant side: Secondary | ICD-10-CM | POA: Insufficient documentation

## 2014-12-12 DIAGNOSIS — J80 Acute respiratory distress syndrome: Secondary | ICD-10-CM | POA: Diagnosis not present

## 2014-12-12 DIAGNOSIS — IMO0002 Reserved for concepts with insufficient information to code with codable children: Secondary | ICD-10-CM

## 2014-12-12 DIAGNOSIS — J9601 Acute respiratory failure with hypoxia: Secondary | ICD-10-CM | POA: Diagnosis not present

## 2014-12-12 DIAGNOSIS — R001 Bradycardia, unspecified: Secondary | ICD-10-CM | POA: Diagnosis not present

## 2014-12-12 DIAGNOSIS — R609 Edema, unspecified: Secondary | ICD-10-CM

## 2014-12-12 DIAGNOSIS — R1310 Dysphagia, unspecified: Secondary | ICD-10-CM | POA: Diagnosis present

## 2014-12-12 DIAGNOSIS — B952 Enterococcus as the cause of diseases classified elsewhere: Secondary | ICD-10-CM | POA: Diagnosis present

## 2014-12-12 DIAGNOSIS — Z452 Encounter for adjustment and management of vascular access device: Secondary | ICD-10-CM

## 2014-12-12 DIAGNOSIS — T380X5A Adverse effect of glucocorticoids and synthetic analogues, initial encounter: Secondary | ICD-10-CM | POA: Diagnosis present

## 2014-12-12 DIAGNOSIS — N179 Acute kidney failure, unspecified: Secondary | ICD-10-CM | POA: Diagnosis present

## 2014-12-12 DIAGNOSIS — K7200 Acute and subacute hepatic failure without coma: Secondary | ICD-10-CM | POA: Diagnosis not present

## 2014-12-12 DIAGNOSIS — E872 Acidosis: Secondary | ICD-10-CM | POA: Diagnosis present

## 2014-12-12 DIAGNOSIS — J45909 Unspecified asthma, uncomplicated: Secondary | ICD-10-CM | POA: Diagnosis present

## 2014-12-12 DIAGNOSIS — I5032 Chronic diastolic (congestive) heart failure: Secondary | ICD-10-CM | POA: Diagnosis present

## 2014-12-12 DIAGNOSIS — J449 Chronic obstructive pulmonary disease, unspecified: Secondary | ICD-10-CM | POA: Diagnosis present

## 2014-12-12 DIAGNOSIS — G4089 Other seizures: Secondary | ICD-10-CM | POA: Diagnosis present

## 2014-12-12 DIAGNOSIS — Z6841 Body Mass Index (BMI) 40.0 and over, adult: Secondary | ICD-10-CM | POA: Diagnosis not present

## 2014-12-12 DIAGNOSIS — R739 Hyperglycemia, unspecified: Secondary | ICD-10-CM | POA: Diagnosis present

## 2014-12-12 DIAGNOSIS — R6521 Severe sepsis with septic shock: Secondary | ICD-10-CM | POA: Diagnosis not present

## 2014-12-12 DIAGNOSIS — T8089XA Other complications following infusion, transfusion and therapeutic injection, initial encounter: Secondary | ICD-10-CM

## 2014-12-12 DIAGNOSIS — A419 Sepsis, unspecified organism: Secondary | ICD-10-CM | POA: Diagnosis not present

## 2014-12-12 DIAGNOSIS — R4182 Altered mental status, unspecified: Secondary | ICD-10-CM | POA: Insufficient documentation

## 2014-12-12 DIAGNOSIS — K219 Gastro-esophageal reflux disease without esophagitis: Secondary | ICD-10-CM | POA: Diagnosis present

## 2014-12-12 DIAGNOSIS — G9389 Other specified disorders of brain: Secondary | ICD-10-CM

## 2014-12-12 DIAGNOSIS — C7931 Secondary malignant neoplasm of brain: Secondary | ICD-10-CM

## 2014-12-12 DIAGNOSIS — I469 Cardiac arrest, cause unspecified: Secondary | ICD-10-CM | POA: Diagnosis not present

## 2014-12-12 DIAGNOSIS — R471 Dysarthria and anarthria: Secondary | ICD-10-CM | POA: Diagnosis present

## 2014-12-12 DIAGNOSIS — G4733 Obstructive sleep apnea (adult) (pediatric): Secondary | ICD-10-CM | POA: Diagnosis present

## 2014-12-12 DIAGNOSIS — N390 Urinary tract infection, site not specified: Secondary | ICD-10-CM | POA: Diagnosis present

## 2014-12-12 DIAGNOSIS — C8590 Non-Hodgkin lymphoma, unspecified, unspecified site: Secondary | ICD-10-CM | POA: Diagnosis present

## 2014-12-12 DIAGNOSIS — J690 Pneumonitis due to inhalation of food and vomit: Secondary | ICD-10-CM | POA: Diagnosis not present

## 2014-12-12 DIAGNOSIS — I10 Essential (primary) hypertension: Secondary | ICD-10-CM | POA: Diagnosis present

## 2014-12-12 DIAGNOSIS — C8599 Non-Hodgkin lymphoma, unspecified, extranodal and solid organ sites: Principal | ICD-10-CM | POA: Diagnosis present

## 2014-12-12 DIAGNOSIS — D496 Neoplasm of unspecified behavior of brain: Secondary | ICD-10-CM | POA: Diagnosis present

## 2014-12-12 DIAGNOSIS — R579 Shock, unspecified: Secondary | ICD-10-CM | POA: Insufficient documentation

## 2014-12-12 HISTORY — PX: CRANIOTOMY: SHX93

## 2014-12-12 LAB — TYPE AND SCREEN
ABO/RH(D): O POS
ANTIBODY SCREEN: NEGATIVE

## 2014-12-12 LAB — MRSA PCR SCREENING: MRSA by PCR: NEGATIVE

## 2014-12-12 LAB — ABO/RH: ABO/RH(D): O POS

## 2014-12-12 SURGERY — CRANIOTOMY HEMATOMA EVACUATION SUBDURAL
Anesthesia: General | Site: Head

## 2014-12-12 MED ORDER — FENTANYL CITRATE 0.05 MG/ML IJ SOLN
INTRAMUSCULAR | Status: DC | PRN
  Administered 2014-12-12 (×2): 50 ug via INTRAVENOUS
  Administered 2014-12-12: 100 ug via INTRAVENOUS
  Administered 2014-12-12: 50 ug via INTRAVENOUS

## 2014-12-12 MED ORDER — PROPOFOL 10 MG/ML IV BOLUS
INTRAVENOUS | Status: AC
  Filled 2014-12-12: qty 20

## 2014-12-12 MED ORDER — MOMETASONE FURO-FORMOTEROL FUM 100-5 MCG/ACT IN AERO
2.0000 | INHALATION_SPRAY | Freq: Two times a day (BID) | RESPIRATORY_TRACT | Status: DC
  Administered 2014-12-12 – 2015-01-02 (×37): 2 via RESPIRATORY_TRACT
  Filled 2014-12-12 (×3): qty 8.8

## 2014-12-12 MED ORDER — ALBUTEROL SULFATE (2.5 MG/3ML) 0.083% IN NEBU
2.5000 mg | INHALATION_SOLUTION | Freq: Four times a day (QID) | RESPIRATORY_TRACT | Status: DC | PRN
Start: 2014-12-12 — End: 2015-01-03

## 2014-12-12 MED ORDER — 0.9 % SODIUM CHLORIDE (POUR BTL) OPTIME
TOPICAL | Status: DC | PRN
  Administered 2014-12-12 (×2): 1000 mL

## 2014-12-12 MED ORDER — THROMBIN 20000 UNITS EX SOLR
CUTANEOUS | Status: DC | PRN
  Administered 2014-12-12: 18:00:00 via TOPICAL

## 2014-12-12 MED ORDER — LOSARTAN POTASSIUM 50 MG PO TABS
100.0000 mg | ORAL_TABLET | Freq: Every day | ORAL | Status: DC
  Administered 2014-12-14 – 2015-01-02 (×20): 100 mg via ORAL
  Filled 2014-12-12 (×21): qty 2

## 2014-12-12 MED ORDER — SODIUM CHLORIDE 0.9 % IV SOLN
INTRAVENOUS | Status: DC | PRN
  Administered 2014-12-12: 15:00:00 via INTRAVENOUS

## 2014-12-12 MED ORDER — LIDOCAINE HCL (CARDIAC) 20 MG/ML IV SOLN
INTRAVENOUS | Status: AC
  Filled 2014-12-12: qty 5

## 2014-12-12 MED ORDER — DEXAMETHASONE SODIUM PHOSPHATE 10 MG/ML IJ SOLN
INTRAMUSCULAR | Status: DC | PRN
  Administered 2014-12-12: 20 mg via INTRAVENOUS

## 2014-12-12 MED ORDER — ONDANSETRON HCL 4 MG/2ML IJ SOLN
INTRAMUSCULAR | Status: DC | PRN
  Administered 2014-12-12: 4 mg via INTRAVENOUS

## 2014-12-12 MED ORDER — HYDROCODONE-ACETAMINOPHEN 5-325 MG PO TABS
1.0000 | ORAL_TABLET | ORAL | Status: DC | PRN
Start: 2014-12-12 — End: 2015-01-02
  Administered 2014-12-14: 1 via ORAL
  Filled 2014-12-12: qty 1

## 2014-12-12 MED ORDER — LEVETIRACETAM IN NACL 500 MG/100ML IV SOLN
500.0000 mg | Freq: Two times a day (BID) | INTRAVENOUS | Status: DC
  Administered 2014-12-12 – 2014-12-23 (×23): 500 mg via INTRAVENOUS
  Filled 2014-12-12 (×28): qty 100

## 2014-12-12 MED ORDER — PROPOFOL 10 MG/ML IV BOLUS
INTRAVENOUS | Status: DC | PRN
  Administered 2014-12-12: 50 mg via INTRAVENOUS
  Administered 2014-12-12: 200 mg via INTRAVENOUS

## 2014-12-12 MED ORDER — ACETAMINOPHEN 650 MG RE SUPP: 650.0000 mg | RECTAL | Status: DC | PRN

## 2014-12-12 MED ORDER — DOCUSATE SODIUM 100 MG PO CAPS
100.0000 mg | ORAL_CAPSULE | Freq: Two times a day (BID) | ORAL | Status: DC
  Administered 2014-12-14 – 2015-01-02 (×35): 100 mg via ORAL
  Filled 2014-12-12 (×43): qty 1

## 2014-12-12 MED ORDER — ONDANSETRON HCL 4 MG/2ML IJ SOLN: 4.0000 mg | INTRAMUSCULAR | Status: DC | PRN

## 2014-12-12 MED ORDER — VECURONIUM BROMIDE 10 MG IV SOLR
INTRAVENOUS | Status: DC | PRN
  Administered 2014-12-12: 3 mg via INTRAVENOUS
  Administered 2014-12-12 (×2): 2 mg via INTRAVENOUS
  Administered 2014-12-12: 1 mg via INTRAVENOUS

## 2014-12-12 MED ORDER — ACETAMINOPHEN 325 MG PO TABS
650.0000 mg | ORAL_TABLET | ORAL | Status: DC | PRN
  Filled 2014-12-12: qty 2

## 2014-12-12 MED ORDER — PROMETHAZINE HCL 25 MG PO TABS: 12.5000 mg | ORAL_TABLET | ORAL | Status: DC | PRN

## 2014-12-12 MED ORDER — LOSARTAN POTASSIUM-HCTZ 100-12.5 MG PO TABS: 1.0000 | ORAL_TABLET | Freq: Every day | ORAL | Status: DC

## 2014-12-12 MED ORDER — FENTANYL CITRATE 0.05 MG/ML IJ SOLN
INTRAMUSCULAR | Status: AC
  Filled 2014-12-12: qty 5

## 2014-12-12 MED ORDER — ROCURONIUM BROMIDE 50 MG/5ML IV SOLN
INTRAVENOUS | Status: AC
  Filled 2014-12-12: qty 2

## 2014-12-12 MED ORDER — PROMETHAZINE HCL 25 MG/ML IJ SOLN: 6.2500 mg | INTRAMUSCULAR | Status: DC | PRN

## 2014-12-12 MED ORDER — DEXAMETHASONE 4 MG PO TABS: 8.0000 mg | ORAL_TABLET | Freq: Three times a day (TID) | ORAL | Status: DC

## 2014-12-12 MED ORDER — SODIUM CHLORIDE 0.9 % IR SOLN
Status: DC | PRN
  Administered 2014-12-12: 18:00:00

## 2014-12-12 MED ORDER — FENTANYL CITRATE 0.05 MG/ML IJ SOLN: 25.0000 ug | INTRAMUSCULAR | Status: DC | PRN

## 2014-12-12 MED ORDER — BUPIVACAINE-EPINEPHRINE 0.5% -1:200000 IJ SOLN
INTRAMUSCULAR | Status: DC | PRN
  Administered 2014-12-12: 2 mL

## 2014-12-12 MED ORDER — ALBUTEROL SULFATE HFA 108 (90 BASE) MCG/ACT IN AERS: 2.0000 | INHALATION_SPRAY | Freq: Four times a day (QID) | RESPIRATORY_TRACT | Status: DC | PRN

## 2014-12-12 MED ORDER — ONDANSETRON HCL 4 MG/2ML IJ SOLN
INTRAMUSCULAR | Status: AC
  Filled 2014-12-12: qty 2

## 2014-12-12 MED ORDER — CEFAZOLIN SODIUM-DEXTROSE 2-3 GM-% IV SOLR
INTRAVENOUS | Status: DC | PRN
  Administered 2014-12-12: 2 g via INTRAVENOUS

## 2014-12-12 MED ORDER — PANTOPRAZOLE SODIUM 40 MG IV SOLR
40.0000 mg | Freq: Every day | INTRAVENOUS | Status: DC
  Administered 2014-12-12 – 2014-12-13 (×2): 40 mg via INTRAVENOUS
  Filled 2014-12-12 (×3): qty 40

## 2014-12-12 MED ORDER — GLYCOPYRROLATE 0.2 MG/ML IJ SOLN
INTRAMUSCULAR | Status: DC | PRN
  Administered 2014-12-12: 0.6 mg via INTRAVENOUS
  Administered 2014-12-12: 0.2 mg via INTRAVENOUS

## 2014-12-12 MED ORDER — ONDANSETRON HCL 4 MG PO TABS: 4.0000 mg | ORAL_TABLET | ORAL | Status: DC | PRN

## 2014-12-12 MED ORDER — DEXAMETHASONE SODIUM PHOSPHATE 4 MG/ML IJ SOLN
8.0000 mg | Freq: Three times a day (TID) | INTRAMUSCULAR | Status: DC
  Administered 2014-12-12 – 2014-12-18 (×19): 8 mg via INTRAVENOUS
  Filled 2014-12-12 (×22): qty 2

## 2014-12-12 MED ORDER — HYDROCHLOROTHIAZIDE 12.5 MG PO CAPS
12.5000 mg | ORAL_CAPSULE | Freq: Every day | ORAL | Status: DC
  Administered 2014-12-14 – 2015-01-02 (×20): 12.5 mg via ORAL
  Filled 2014-12-12 (×21): qty 1

## 2014-12-12 MED ORDER — CEFAZOLIN SODIUM-DEXTROSE 2-3 GM-% IV SOLR
2.0000 g | Freq: Three times a day (TID) | INTRAVENOUS | Status: AC
  Administered 2014-12-12 – 2014-12-13 (×2): 2 g via INTRAVENOUS
  Filled 2014-12-12 (×2): qty 50

## 2014-12-12 MED ORDER — MIDAZOLAM HCL 2 MG/2ML IJ SOLN
INTRAMUSCULAR | Status: AC
  Filled 2014-12-12: qty 2

## 2014-12-12 MED ORDER — NEOSTIGMINE METHYLSULFATE 10 MG/10ML IV SOLN
INTRAVENOUS | Status: DC | PRN
  Administered 2014-12-12: 5 mg via INTRAVENOUS

## 2014-12-12 MED ORDER — POTASSIUM CHLORIDE IN NACL 20-0.9 MEQ/L-% IV SOLN
INTRAVENOUS | Status: DC
  Administered 2014-12-12 – 2014-12-16 (×4): via INTRAVENOUS
  Administered 2014-12-16: 1000 mL via INTRAVENOUS
  Administered 2014-12-17: 06:00:00 via INTRAVENOUS
  Filled 2014-12-12 (×12): qty 1000

## 2014-12-12 MED ORDER — LACTATED RINGERS IV SOLN
INTRAVENOUS | Status: DC
  Administered 2014-12-12: 50 mL/h via INTRAVENOUS

## 2014-12-12 MED ORDER — LIDOCAINE HCL (CARDIAC) 20 MG/ML IV SOLN
INTRAVENOUS | Status: DC | PRN
  Administered 2014-12-12: 100 mg via INTRAVENOUS

## 2014-12-12 MED ORDER — MEPERIDINE HCL 25 MG/ML IJ SOLN: 6.2500 mg | INTRAMUSCULAR | Status: DC | PRN

## 2014-12-12 MED ORDER — MORPHINE SULFATE 2 MG/ML IJ SOLN: 1.0000 mg | INTRAMUSCULAR | Status: DC | PRN

## 2014-12-12 MED ORDER — PHENYLEPHRINE HCL 10 MG/ML IJ SOLN
INTRAMUSCULAR | Status: DC | PRN
  Administered 2014-12-12 (×3): 40 ug via INTRAVENOUS

## 2014-12-12 MED ORDER — LABETALOL HCL 5 MG/ML IV SOLN: 10.0000 mg | INTRAVENOUS | Status: DC | PRN

## 2014-12-12 MED ORDER — ROSUVASTATIN CALCIUM 10 MG PO TABS
10.0000 mg | ORAL_TABLET | Freq: Every evening | ORAL | Status: DC
  Administered 2014-12-13 – 2014-12-17 (×5): 10 mg via ORAL
  Filled 2014-12-12 (×7): qty 1

## 2014-12-12 MED ORDER — ROCURONIUM BROMIDE 100 MG/10ML IV SOLN
INTRAVENOUS | Status: DC | PRN
  Administered 2014-12-12: 50 mg via INTRAVENOUS

## 2014-12-12 SURGICAL SUPPLY — 65 items
BAG DECANTER FOR FLEXI CONT (MISCELLANEOUS) IMPLANT
BIT DRILL WIRE PASS 1.3MM (BIT) IMPLANT
BLADE CLIPPER SURG NEURO (BLADE) ×3 IMPLANT
BRUSH SCRUB EZ 1% IODOPHOR (MISCELLANEOUS) ×3 IMPLANT
BRUSH SCRUB EZ PLAIN DRY (MISCELLANEOUS) ×3 IMPLANT
BUR ACORN 6.0 PRECISION (BURR) ×2 IMPLANT
BUR ACORN 6.0MM PRECISION (BURR) ×1
BUR ROUTER D-58 CRANI (BURR) IMPLANT
CANISTER SUCT 3000ML PPV (MISCELLANEOUS) ×3 IMPLANT
CLIP TI MEDIUM 6 (CLIP) IMPLANT
CONT SPEC 4OZ CLIKSEAL STRL BL (MISCELLANEOUS) ×6 IMPLANT
COVER BACK TABLE 60X90IN (DRAPES) IMPLANT
DRAIN SNY WOU 7FLT (WOUND CARE) IMPLANT
DRAPE NEUROLOGICAL W/INCISE (DRAPES) ×6 IMPLANT
DRAPE SURG 17X23 STRL (DRAPES) IMPLANT
DRAPE WARM FLUID 44X44 (DRAPE) ×3 IMPLANT
DRILL WIRE PASS 1.3MM (BIT)
DRSG TELFA 3X8 NADH (GAUZE/BANDAGES/DRESSINGS) ×3 IMPLANT
ELECT CAUTERY BLADE 6.4 (BLADE) ×3 IMPLANT
ELECT REM PT RETURN 9FT ADLT (ELECTROSURGICAL) ×3
ELECTRODE REM PT RTRN 9FT ADLT (ELECTROSURGICAL) ×1 IMPLANT
EVACUATOR 1/8 PVC DRAIN (DRAIN) IMPLANT
EVACUATOR SILICONE 100CC (DRAIN) IMPLANT
GAUZE SPONGE 4X4 12PLY STRL (GAUZE/BANDAGES/DRESSINGS) ×3 IMPLANT
GAUZE SPONGE 4X4 16PLY XRAY LF (GAUZE/BANDAGES/DRESSINGS) IMPLANT
GLOVE BIO SURGEON STRL SZ7 (GLOVE) ×6 IMPLANT
GLOVE BIO SURGEON STRL SZ7.5 (GLOVE) ×6 IMPLANT
GLOVE BIO SURGEON STRL SZ8.5 (GLOVE) ×3 IMPLANT
GLOVE EXAM NITRILE LRG STRL (GLOVE) IMPLANT
GLOVE EXAM NITRILE MD LF STRL (GLOVE) IMPLANT
GLOVE EXAM NITRILE XL STR (GLOVE) IMPLANT
GLOVE EXAM NITRILE XS STR PU (GLOVE) IMPLANT
GLOVE SS BIOGEL STRL SZ 8 (GLOVE) ×3 IMPLANT
GLOVE SUPERSENSE BIOGEL SZ 8 (GLOVE) ×6
GOWN STRL REUS W/ TWL LRG LVL3 (GOWN DISPOSABLE) ×2 IMPLANT
GOWN STRL REUS W/ TWL XL LVL3 (GOWN DISPOSABLE) ×3 IMPLANT
GOWN STRL REUS W/TWL LRG LVL3 (GOWN DISPOSABLE) ×4
GOWN STRL REUS W/TWL XL LVL3 (GOWN DISPOSABLE) ×6
KIT BASIN OR (CUSTOM PROCEDURE TRAY) ×3 IMPLANT
KIT ROOM TURNOVER OR (KITS) ×3 IMPLANT
MARKER SKIN DUAL TIP RULER LAB (MISCELLANEOUS) IMPLANT
NEEDLE HYPO 22GX1.5 SAFETY (NEEDLE) ×3 IMPLANT
NS IRRIG 1000ML POUR BTL (IV SOLUTION) ×3 IMPLANT
PACK CRANIOTOMY (CUSTOM PROCEDURE TRAY) ×3 IMPLANT
PAD ARMBOARD 7.5X6 YLW CONV (MISCELLANEOUS) ×3 IMPLANT
PATTIES SURGICAL .25X.25 (GAUZE/BANDAGES/DRESSINGS) IMPLANT
PATTIES SURGICAL .5 X.5 (GAUZE/BANDAGES/DRESSINGS) IMPLANT
PATTIES SURGICAL .5 X3 (DISPOSABLE) IMPLANT
PATTIES SURGICAL 1X1 (DISPOSABLE) IMPLANT
PIN MAYFIELD SKULL DISP (PIN) IMPLANT
RUBBERBAND STERILE (MISCELLANEOUS) IMPLANT
SPONGE NEURO XRAY DETECT 1X3 (DISPOSABLE) IMPLANT
STAPLER SKIN PROX WIDE 3.9 (STAPLE) ×3 IMPLANT
SUT ETHILON 3 0 FSL (SUTURE) IMPLANT
SUT NURALON 4 0 TR CR/8 (SUTURE) ×6 IMPLANT
SUT PROLENE 6 0 BV (SUTURE) IMPLANT
SUT VIC AB 2-0 CP2 18 (SUTURE) ×3 IMPLANT
SUT VIC AB 3-0 FS2 27 (SUTURE) ×3 IMPLANT
SUT VICRYL 4-0 PS2 18IN ABS (SUTURE) IMPLANT
SYR CONTROL 10ML LL (SYRINGE) ×3 IMPLANT
TAPE CLOTH SURG 4X10 WHT LF (GAUZE/BANDAGES/DRESSINGS) ×3 IMPLANT
TOWEL OR 17X24 6PK STRL BLUE (TOWEL DISPOSABLE) ×3 IMPLANT
TOWEL OR 17X26 10 PK STRL BLUE (TOWEL DISPOSABLE) ×3 IMPLANT
UNDERPAD 30X30 INCONTINENT (UNDERPADS AND DIAPERS) IMPLANT
WATER STERILE IRR 1000ML POUR (IV SOLUTION) ×3 IMPLANT

## 2014-12-12 NOTE — Plan of Care (Signed)
Problem: Consults Goal: Diagnosis - Craniotomy Outcome: Completed/Met Date Met:  12/11/2014 Tumor-Biopsy

## 2014-12-12 NOTE — Progress Notes (Signed)
Subjective:  The patient is somnolent but arousable. She is in no apparent distress.  Objective: Vital signs in last 24 hours: Temp:  [97 F (36.1 C)] 97 F (36.1 C) (02/22 0922) Pulse Rate:  [53] 53 (02/22 0922) Resp:  [18] 18 (02/22 0922) BP: (159)/(52) 159/52 mmHg (02/22 0922) SpO2:  [94 %] 94 % (02/22 0922) Weight:  [120.901 kg (266 lb 8.6 oz)] 120.901 kg (266 lb 8.6 oz) (02/22 0922)  Intake/Output from previous day:   Intake/Output this shift: Total I/O In: 900 [I.V.:900] Out: 50 [Blood:50]  Physical exam the patient is somnolent but arousable. She is moving all 4 extremities. Her pupils are equal.  Lab Results: No results for input(s): WBC, HGB, HCT, PLT in the last 72 hours. BMET No results for input(s): NA, K, CL, CO2, GLUCOSE, BUN, CREATININE, CALCIUM in the last 72 hours.  Studies/Results: Mr Kizzie Fantasia Contrast  11/24/2014   CLINICAL DATA:  Continued surveillance intracranial mass lesions. Preoperative exam. RIGHT hemiparesis. Mental status changes.  EXAM: MRI HEAD WITHOUT AND WITH CONTRAST  TECHNIQUE: Multiplanar, multiecho pulse sequences of the brain and surrounding structures were obtained without and with intravenous contrast.  CONTRAST:  MultiHance 20 mL.  COMPARISON:  MR brain 12/08/2014.  FINDINGS: The patient was unable to remain motionless for the exam. Small or subtle lesions could be overlooked. Redemonstrated are multiple enhancing lesions throughout both cerebral hemispheres, LEFT greater than RIGHT, particularly affecting the LEFT basal ganglia. Some of the lesions demonstrate restricted diffusion, are T2 hypointense, and are periventricular in location. There is a moderate amount of associated vasogenic edema with slight LEFT-to-RIGHT midline shift of 3 mm. No new lesions have developed since priors. No dominant meningeal enhancement. Upper cervical region unremarkable. Extracranial soft tissues within normal limits.  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 No new lesions have developed since prior MR. Slight improvement LEFT-to-RIGHT shift following steroid administration.   Electronically Signed   By: Rolla Flatten M.D.   On: 12/11/2014 14:47    Assessment/Plan: Status post keratectomy brain biopsy: The patient seems to be doing well. I spoke with the patient's husband and son.  LOS: 0 days     Dorrie Cocuzza D 12/01/2014, 6:46 PM

## 2014-12-12 NOTE — Anesthesia Preprocedure Evaluation (Addendum)
Anesthesia Evaluation  Patient identified by MRN, date of birth, ID band Patient awake    Reviewed: Allergy & Precautions, NPO status , Patient's Chart, lab work & pertinent test results  Airway Mallampati: III  TM Distance: >3 FB Neck ROM: Full    Dental no notable dental hx.    Pulmonary asthma , sleep apnea and Continuous Positive Airway Pressure Ventilation , COPDCurrent Smoker,  breath sounds clear to auscultation  Pulmonary exam normal       Cardiovascular hypertension, Pt. on medications + Peripheral Vascular Disease + Valvular Problems/Murmurs AS Rhythm:Regular Rate:Normal  Echo 07/2013 - Left ventricle: The cavity size was normal. There was moderate concentric hypertrophy. Systolic function was normal. The estimated ejection fraction was in the range of 55% to 60%. Wall motion was normal; there were no regional wall motion abnormalities. There was an increasedrelative contribution of atrial contraction to ventricularfilling. Features are consistent with a pseudonormal leftventricular filling pattern, with concomitant abnormalrelaxation and increased filling pressure (grade 2diastolic dysfunction). - Aortic valve: Moderate thickening and calcification. There was mild stenosis. - Left atrium: The atrium was mildly dilated.    Neuro/Psych negative neurological ROS  negative psych ROS   GI/Hepatic Neg liver ROS, GERD-  Medicated,  Endo/Other  Morbid obesity  Renal/GU negative Renal ROS     Musculoskeletal negative musculoskeletal ROS (+)   Abdominal (+) + obese,   Peds  Hematology negative hematology ROS (+)   Anesthesia Other Findings   Reproductive/Obstetrics negative OB ROS                            Anesthesia Physical Anesthesia Plan  ASA: III  Anesthesia Plan: General   Post-op Pain Management:    Induction: Intravenous  Airway Management Planned:  Oral ETT  Additional Equipment: Arterial line  Intra-op Plan:   Post-operative Plan: Extubation in OR  Informed Consent: I have reviewed the patients History and Physical, chart, labs and discussed the procedure including the risks, benefits and alternatives for the proposed anesthesia with the patient or authorized representative who has indicated his/her understanding and acceptance.   Dental advisory given  Plan Discussed with: CRNA  Anesthesia Plan Comments:        Anesthesia Quick Evaluation

## 2014-12-12 NOTE — H&P (Signed)
Tanya King is a 64 year old white female who presented with mental status changes and right hemiparesis/apraxia. She was worked up with a brain MRI which demonstrated multiple brain lesions. A CT of the chest abdomen and pelvis did not demonstrate any primary lesions. I discussed the situation with the patient and her family. We discussed the various treatment options. The patient, and her family have elected to proceed with a stereotactic brain biopsy.   Physical exam: The patient is alert and somewhat slow with mentation. She is oriented 1, person and a hospital. She could not tell me the name of the hospital nor the month/year. She is weak in her right upper extremity at 4+ over 5, particularly her in her hand. She has right hand apraxia.  I have reviewed the patient's brain MRI performed at [facility] today with and without contrast. It demonstrates multiple intracranial lesions.  Assessment and plan: Multiple brain lesions: I have discussed the situation with the patient and her family. We discussed the various treatment options including a stereotactic brain biopsy. She has multiple lesions. I'm reluctant to biopsy the left superficial lesion as it is in the cortex. I think the most accessible and "hitable" lesion is the left basal ganglia. I have answered all their questions regarding surgery. She wants to proceed with the operation.  Her history and exam is otherwise as documented on her consultation of 12/09/2014.

## 2014-12-12 NOTE — Progress Notes (Signed)
Spoke with Cecilie Lowers in Neuro OR room with Dr. Adline Mango, notified that order not signed and patient will need to sign consent.

## 2014-12-12 NOTE — Anesthesia Postprocedure Evaluation (Signed)
Anesthesia Post Note  Patient: Tanya King  Procedure(s) Performed: Procedure(s) (LRB): LEFT BRAIN BIOPSY w/ Curve (N/A)  Anesthesia type: general  Patient location: PACU  Post pain: Pain level controlled  Post assessment: Patient's Cardiovascular Status Stable  Last Vitals:  Filed Vitals:   11/29/2014 1915  BP:   Pulse: 44  Temp:   Resp: 17    Post vital signs: Reviewed and stable  Level of consciousness: sedated  Complications: No apparent anesthesia complications

## 2014-12-12 NOTE — Op Note (Signed)
Brief history: The patient is a 64 year old white female who presented with mental status changes and right hemiparesis. She was worked up with a head CT and brain MRI which demonstrated multiple intracranial lesions. A metastatic workup was negative. I discussed the various treatment options with the patient, and her family, including a stereotactic brain biopsy. I discussed the procedure as well as the risks, benefits, and alternatives. I have answered all the patient's and her family's, questions. She has decided to proceed with a stereotactic brain biopsy.  Preop diagnosis: Multiple intracranial lesions  Postop diagnosis: The same  Procedure: Left stereotactic brain biopsy with the BrainLab neuronavigational system.  Surgeon: Dr. Earle Gell  Assistant: Dr. Kary Kos  Anesthesia: Gen. endotracheal  Specimens: Brain biopsy  Complications: None  Description of procedure: We loaded the patient's brain MRI scan into the BrainLab neuronavigational computer. The patient was brought to the operating room by the anesthesia team. General endotracheal anesthesia was induced. The patient remained in the supine position. I applied the [place]field 3 point headrest to the patient's calvarium. We registered the patient's calvarium using the laser guide. I shaved the patient's right frontal scalp with the clippers. This region was then prepared with DuraPrep. Sterile drapes were applied. I set up the biopsy arm, adjusting all the joints. I then injected the area to be incised with Marcaine with epinephrine solution. Patient's scalp stomach and small incision just posterior to patient's left coronal suture. I used the Geologist, engineering for exposure. I used a high-speed drill to create a left frontal burr hole. I then inserted a needle through the biopsy arm. I obtained a specimen of the left basal ganglia lesion. Upon aspiration we did get a small amount of bleeding. Therefore I did not want to do any more  biopsies for fear of creating more bleeding. I then removed the biopsy needle and removed the biopsy arm. I irrigated the wound out with bacitracin solution. I placed a small piece of Gelfoam over the exposed dura and the burr hole. I reapproximated the patient's galea with interrupted 2-0 Vicryl suture. I reapproximated the skin with stainless steel staples.The wound was then coated with bacitracin ointment. A sterile dressing was applied. The drapes were removed. I then removed the Mayfield 3 point headrest from the patient's calvarium. There was a small laceration on the patient's right frontal for head which I reapproximated with a Steri-Strip.

## 2014-12-12 NOTE — Transfer of Care (Signed)
Immediate Anesthesia Transfer of Care Note  Patient: Tanya King  Procedure(s) Performed: Procedure(s) with comments: LEFT BRAIN BIOPSY w/ Curve (N/A) - brain biopsy with brain lab  Patient Location: ICU  Anesthesia Type:General  Level of Consciousness: patient cooperative and lethargic  Airway & Oxygen Therapy: Patient Spontanous Breathing and Patient connected to face mask oxygen  Post-op Assessment: Report given to RN, Post -op Vital signs reviewed and stable, Patient moving all extremities and Patient moving all extremities X 4  Post vital signs: Reviewed and stable  Last Vitals:  Filed Vitals:   11/24/2014 0922  BP: 159/52  Pulse: 53  Temp: 36.1 C  Resp: 18    Complications: No apparent anesthesia complications

## 2014-12-12 NOTE — Anesthesia Procedure Notes (Signed)
Procedure Name: Intubation Date/Time: 11/26/2014 3:35 PM Performed by: Shirlyn Goltz Pre-anesthesia Checklist: Patient identified, Emergency Drugs available, Suction available and Patient being monitored Patient Re-evaluated:Patient Re-evaluated prior to inductionOxygen Delivery Method: Circle system utilized Preoxygenation: Pre-oxygenation with 100% oxygen Intubation Type: IV induction Ventilation: Oral airway inserted - appropriate to patient size and Mask ventilation without difficulty Laryngoscope Size: Miller and 2 Grade View: Grade III Tube type: Oral Tube size: 7.0 mm Number of attempts: 1 Airway Equipment and Method: Stylet Placement Confirmation: ETT inserted through vocal cords under direct vision,  positive ETCO2 and breath sounds checked- equal and bilateral Secured at: 22 cm Tube secured with: Tape Dental Injury: Teeth and Oropharynx as per pre-operative assessment

## 2014-12-12 NOTE — Progress Notes (Signed)
No order given for diet, despite meds ordered PO. Spoke with Dr. Saintclair Halsted to clarify. Patient's speech very slurred and coughed with small sip of water. Decadron changed to IV. Patient to be evaluated for swallowing in the morning.

## 2014-12-13 ENCOUNTER — Ambulatory Visit
Admit: 2014-12-13 | Discharge: 2014-12-13 | Disposition: A | Discharge status: Home / Self Care | Payer: 59 | Attending: Radiation Oncology | Admitting: Radiation Oncology

## 2014-12-13 ENCOUNTER — Inpatient Hospital Stay (HOSPITAL_COMMUNITY): Payer: 59

## 2014-12-13 ENCOUNTER — Encounter (HOSPITAL_COMMUNITY): Payer: Self-pay | Admitting: Neurosurgery

## 2014-12-13 MED ORDER — RESOURCE THICKENUP CLEAR PO POWD
ORAL | Status: DC | PRN
  Filled 2014-12-13 (×2): qty 125

## 2014-12-13 MED ORDER — CETYLPYRIDINIUM CHLORIDE 0.05 % MT LIQD
7.0000 mL | Freq: Two times a day (BID) | OROMUCOSAL | Status: DC
  Administered 2014-12-13 – 2015-01-01 (×39): 7 mL via OROMUCOSAL

## 2014-12-13 NOTE — Progress Notes (Signed)
Patient ID: Tanya King, female   DOB: 1951-02-20, 64 y.o.   MRN: [id number] Subjective:  The patient is alert and pleasant. She is a bit more dysarthric today. She is in no apparent distress.  Objective: Vital signs in last 24 hours: Temp:  [97 F (36.1 C)-97.7 F (36.5 C)] 97.6 F (36.4 C) (02/23 0343) Pulse Rate:  [41-53] 49 (02/23 0700) Resp:  [15-25] 16 (02/23 0700) BP: (95-159)/(52-79) 102/61 mmHg (02/23 0700) SpO2:  [91 %-98 %] 95 % (02/23 0700) Arterial Line BP: (153-167)/(49-58) 156/50 mmHg (02/22 1917) Weight:  [120.901 kg (266 lb 8.6 oz)] 120.901 kg (266 lb 8.6 oz) (02/22 0922)  Intake/Output from previous day: 02/22 0701 - 02/23 0700 In: 1755 [I.V.:1555; IV Piggyback:200] Out: 50 [Blood:50] Intake/Output this shift:    Physical exam the patient is alert and pleasant. She is dysarthric. She is right hemiparetic, more so in her upper extremity than lower extremity. Her pupils are equal. She is oriented 1.  I have reviewed the patient's head CT performed this morning. She has a small bleed at the biopsy site. There is no significant mass effect.  Lab Results: No results for input(s): WBC, HGB, HCT, PLT in the last 72 hours. BMET No results for input(s): NA, K, CL, CO2, GLUCOSE, BUN, CREATININE, CALCIUM in the last 72 hours.  Studies/Results: Mr Kizzie Fantasia Contrast  11/21/2014   CLINICAL DATA:  Continued surveillance intracranial mass lesions. Preoperative exam. RIGHT hemiparesis. Mental status changes.  EXAM: MRI HEAD WITHOUT AND WITH CONTRAST  TECHNIQUE: Multiplanar, multiecho pulse sequences of the brain and surrounding structures were obtained without and with intravenous contrast.  CONTRAST:  MultiHance 20 mL.  COMPARISON:  MR brain 12/08/2014.  FINDINGS: The patient was unable to remain motionless for the exam. Small or subtle lesions could be overlooked. Redemonstrated are multiple enhancing lesions throughout both cerebral hemispheres, LEFT greater than RIGHT,  particularly affecting the LEFT basal ganglia. Some of the lesions demonstrate restricted diffusion, are T2 hypointense, and are periventricular in location. There is a moderate amount of associated vasogenic edema with slight LEFT-to-RIGHT midline shift of 3 mm. No new lesions have developed since priors. No dominant meningeal enhancement. Upper cervical region unremarkable. Extracranial soft tissues within normal limits.  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 No new lesions have developed since prior MR. Slight improvement LEFT-to-RIGHT shift following steroid administration.   Electronically Signed   By: Rolla Flatten M.D.   On: 12/04/2014 14:47    Assessment/Plan: Intracranial lesions, status post stereotactic brain biopsy: The patient's preoperative deficits are slightly worse since her biopsy and small bleed. I have spoken to the patient's husband. We will have her seen by PT, OT and speech. She will likely be able to go home in a few days. Pathology is pending.  LOS: 1 day     Loanne Emery D 12/13/2014, 7:57 AM

## 2014-12-13 NOTE — Progress Notes (Signed)
Reported to Dr. Arnoldo Morale that pt has been unable to swallow, has been incontinent, and moving rt arm very little overnight.

## 2014-12-13 NOTE — Evaluation (Signed)
Clinical/Bedside Swallow Evaluation Patient Details  Name: Tanya King MRN: [id number] Date of Birth: [birth year]  Today's Date: 12/13/2014 Time: SLP Start Time (ACUTE ONLY): 1601 SLP Stop Time (ACUTE ONLY): 0931 SLP Time Calculation (min) (ACUTE ONLY): 32 min  Past Medical History:  Past Medical History  Diagnosis Date  . Asthma   . Hypercholesteremia   . GERD (gastroesophageal reflux disease)   . Sleep apnea     AHI 116/hr now on BiPAP at 25/35mmHg  . Edema   . HTN (hypertension)   . Allergic rhinitis   . Vitamin B12 deficiency   . RLS (restless legs syndrome)   . Vitamin deficiency   . Obesity   . Aortic stenosis   . Edema     chronic LE edema  . Heart murmur     (mild AV stenosis with no regurg, mild MV sclerosis with mild MR, mod LVH 2012)  . Carotid artery occlusion     <30% bilateral ICA stenosis, no flow in left vertebral artery  . COPD (chronic obstructive pulmonary disease)     per spouse not diagnosed   Past Surgical History:  Past Surgical History  Procedure Laterality Date  . Tubal ligation     HPI:  Pt is a 64 yo female with recent admit to [facility] with right lower extremity weakness and slurred speech x2-3 weeks.  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  Pt dc'd home over the weekend and returned for brain biopsy to [facility] on Monday 11/28/2014.  Pt with mild bleed s/p brain bx with resultant dysarthria, increased right sided weakness, left hemianopsia and swallow evaluation ordered.  PMH + for smoking, asthma, HTN.  Pt was seen by SLP for speech evaluation during [facility] hospital admission.     Assessment / Plan / Recommendation Clinical Impression  Pt presents with symptoms of suspected moderate oral and mild pharyngeal dysphagia.  She is familiar to this SLP and is certainly more dysarthric with worsening expressive language deficits compared to evalution completed last week.    Delayed oral transiting across consistencies noted due to decreased  lingual coordination but not overt residuals.  Piecemealing suspected as pt presents with delayed multiple swallows.  Pharyngeal swallow likely delayed but strength likely intact.    Concern for at least laryngeal penetration/possible aspiration of thin via tsp noted characterized by immediate throat clearing after swallow.  Recommend initiate diet with strict aspiration precautions.  Spouse present and was educated thoroughly to recommendations using teach back, demonstration for assurance of understanding.    SLP to follow up for readiness for dietary advancement, diet tolerance and pt/family education.    Please order speech evaluation for speech/language.  Thanks for this order.      Aspiration Risk  Moderate    Diet Recommendation Dysphagia 1 (Puree);Nectar-thick liquid   Liquid Administration via: Spoon (NO CUP or STRAW) Medication Administration: Crushed with puree Supervision: Full supervision/cueing for compensatory strategies;Trained caregiver to feed patient Compensations: Slow rate;Small sips/bites;Check for pocketing (Delayed swallow, assure pt swallows before giving more) Postural Changes and/or Swallow Maneuvers: Seated upright 90 degrees;Upright 30-60 min after meal    Other  Recommendations Oral Care Recommendations: Oral care before and after PO Other Recommendations: Order thickener from pharmacy   Follow Up Recommendations  Outpatient SLP;Home health SLP    Frequency and Duration min 2x/week  2 weeks   Pertinent Vitals/Pain Afebrile, decreased, weak cough     Swallow Study Prior Functional Status   see  hhx, pt without h/o dysphagia per pt/spouse    General Date of Onset: 12/13/14 HPI: Pt is a 64 yo female with recent admit to [facility] with right lower extremity weakness and slurred speech x2-3 weeks.  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  Pt dc'd home over the weekend and returned for brain biopsy to [facility] on Monday 12/13/2014.  Pt with mild bleed s/p  brain bx with resultant dysarthria, increased right sided weakness, left hemianopsia and swallow evaluation ordered.  PMH + for smoking, asthma, HTN.  Pt was seen by SLP for speech evaluation during [facility] hospital admission.   Type of Study: Bedside swallow evaluation Diet Prior to this Study: NPO (except glycerin swabs) Temperature Spikes Noted: No Respiratory Status: Nasal cannula History of Recent Intubation: No Behavior/Cognition: Alert;Requires cueing;Decreased sustained attention;Cooperative Oral Cavity - Dentition: Adequate natural dentition Self-Feeding Abilities: Needs assist (can hold her own cup but has increased right sided weakness since biopsy) Patient Positioning: Upright in bed Baseline Vocal Quality: Low vocal intensity;Clear Volitional Cough: Weak Volitional Swallow: Unable to elicit    Oral/Motor/Sensory Function Overall Oral Motor/Sensory Function: Impaired Labial ROM: Reduced right Labial Symmetry: Abnormal symmetry right Labial Strength: Reduced Labial Sensation: Reduced Lingual ROM: Reduced right Lingual Symmetry: Within Functional Limits Lingual Strength: Reduced Facial ROM: Reduced right Facial Symmetry: Right droop Facial Strength: Reduced Facial Sensation: Reduced Velum: Impaired left;Impaired right (sluggish when elicited with phonation, no gag response)   Ice Chips Ice chips: Impaired Presentation: Spoon Oral Phase Impairments: Reduced lingual movement/coordination;Impaired anterior to posterior transit;Impaired mastication;Reduced labial seal Oral Phase Functional Implications: Prolonged oral transit Pharyngeal Phase Impairments: Suspected delayed Swallow Other Comments: lingual thrust   Thin Liquid Thin Liquid: Impaired Presentation: Spoon Oral Phase Impairments: Impaired anterior to posterior transit;Reduced lingual movement/coordination;Reduced labial seal Oral Phase Functional Implications: Prolonged oral transit Pharyngeal  Phase Impairments:  Suspected delayed Swallow;Throat Clearing - Immediate Other Comments: weak throat clearing immediately concerning for laryngeal penetration, lingual thrust    Nectar Thick Nectar Thick Liquid: Impaired Presentation: Cup;Spoon Oral Phase Impairments: Reduced lingual movement/coordination;Impaired anterior to posterior transit;Reduced labial seal Oral phase functional implications: Prolonged oral transit;Other (comment) (lingual thrusting) Pharyngeal Phase Impairments: Suspected delayed Swallow;Multiple swallows Other Comments: multiple swallows suspected to be due to piecemealing - not pharyngeal residuals   Honey Thick Honey Thick Liquid: Not tested   Puree Puree: Impaired Presentation: Spoon Oral Phase Impairments: Reduced lingual movement/coordination;Impaired anterior to posterior transit Oral Phase Functional Implications: Prolonged oral transit Pharyngeal Phase Impairments: Suspected delayed Swallow   Solid   GO    Solid: Impaired Oral Phase Impairments: Impaired anterior to posterior transit;Reduced lingual movement/coordination;Impaired mastication Pharyngeal Phase Impairments: Suspected delayed Swallow Other Comments: increased delay in swallow with solids requiring mastication than puree due to lingual/labial weakness       Luanna Salk, [place] [facility] SLP [phone]

## 2014-12-13 NOTE — Progress Notes (Signed)
UR completed.  Rameen Gohlke, RN BSN [facility] CCM Trauma/Neuro ICU Case Manager [phone]  

## 2014-12-14 DIAGNOSIS — G939 Disorder of brain, unspecified: Secondary | ICD-10-CM

## 2014-12-14 MED ORDER — PANTOPRAZOLE SODIUM 40 MG PO TBEC
40.0000 mg | DELAYED_RELEASE_TABLET | Freq: Every day | ORAL | Status: DC
  Administered 2014-12-14 – 2014-12-21 (×7): 40 mg via ORAL
  Filled 2014-12-14 (×9): qty 1

## 2014-12-14 NOTE — Progress Notes (Signed)
Speech Language Pathology Treatment: Dysphagia  Patient Details Name: Tanya King MRN: [id number] DOB: 1951-07-10 Today's Date: 12/14/2014 Time: 7096-2836 SLP Time Calculation (min) (ACUTE ONLY): 8 min  Assessment / Plan / Recommendation Clinical Impression  Follow up to assess po tolerance and evaluate readiness for dietary advancement completed.  Pt's spouse cautiously feeding patient upon SLP entrance to room.  Delayed swallow continues but much improved compared to yesterday.  No lingual thrusting observed today.  Spouse reports pt has had occasional cough throughout day but not correlated to po intake.  Recommend continue current diet *dys1/nectar - advancing to allowing cup boluses as tolerated.  Will follow up for dysphagia management, readiness for advancement.   MD please order speech evaluation if you agree for pt's expressive language deficits.     HPI HPI: Pt is a 64 yo female with recent admit to [facility] with right lower extremity weakness and slurred speech x2-3 weeks.  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  Pt dc'd home over the weekend and returned for brain biopsy to [facility] on Monday 12/16/2014.  Pt with mild bleed s/p brain bx with resultant dysarthria, increased right sided weakness, left hemianopsia and swallow evaluation ordered.  PMH + for smoking, asthma, HTN.  Pt was seen by SLP for speech evaluation during [facility] hospital admission.     Pertinent Vitals Pain Assessment: No/denies pain  SLP Plan  Continue with current plan of care    Recommendations Diet recommendations: Dysphagia 1 (puree);Nectar-thick liquid Liquids provided via: Cup;Teaspoon Medication Administration: Crushed with puree Supervision: Full supervision/cueing for compensatory strategies;Trained caregiver to feed patient Compensations: Slow rate;Small sips/bites;Check for pocketing (spouse feeding pt) Postural Changes and/or Swallow Maneuvers: Seated upright 90 degrees;Upright 30-60 min after  meal              Oral Care Recommendations: Oral care before and after PO Follow up Recommendations: None Plan: Continue with current plan of care    [place], [place] [facility] SLP [phone]

## 2014-12-14 NOTE — Evaluation (Signed)
Physical Therapy Evaluation Patient Details Name: Tanya King MRN: [id number] DOB: 19-Jan-1951 Today's Date: 12/14/2014   History of Present Illness  pt presents post Biopsy for Brain mets with noted operative bleeding.    Clinical Impression  Pt follows most simple directions and participates well with PT.  Pt requires 2 person A for all mobility and at this time would benefit from CIR at D/C to maximize independence.  Will continue to follow.      Follow Up Recommendations CIR    Equipment Recommendations  None recommended by PT    Recommendations for Other Services Rehab consult     Precautions / Restrictions Precautions Precautions: Fall Restrictions Weight Bearing Restrictions: No      Mobility  Bed Mobility Overal bed mobility: Needs Assistance;+2 for physical assistance Bed Mobility: Supine to Sit     Supine to sit: Max assist;+2 for physical assistance     General bed mobility comments: pt needs A with Bil LEs and bringing trunk up to sitting.  pt attempts to A with movement of R LE, but needs facilitation for coming to sitting position 2/2 increased extensor tone.    Transfers Overall transfer level: Needs assistance Equipment used: 2 person hand held assist Transfers: Sit to/from [facility] Sit to Stand: Max assist;+2 physical assistance Stand pivot transfers: Total assist;+2 physical assistance       General transfer comment: pt needs R LE blocked and facilitation for trunk/hip extension.  pt is able to WB through R LE when knee is blocked, but pt was unable to A with pivot to recliner.    Ambulation/Gait                Stairs            Wheelchair Mobility    Modified Rankin (Stroke Patients Only)       Balance Overall balance assessment: Needs assistance Sitting-balance support: Single extremity supported;Feet supported Sitting balance-Leahy Scale: Poor Sitting balance - Comments: pt with posterior lean.    Postural control: Posterior lean Standing balance support: During functional activity Standing balance-Leahy Scale: Zero                               Pertinent Vitals/Pain Pain Assessment: No/denies pain    Home Living Family/patient expects to be discharged to:: Inpatient rehab                      Prior Function Level of Independence: Independent         Comments: Over last few weeks pt had been having memory and R hand deficits.       Hand Dominance   Dominant Hand: Right    Extremity/Trunk Assessment   Upper Extremity Assessment: Defer to OT evaluation           Lower Extremity Assessment: RLE deficits/detail RLE Deficits / Details: pt with increased extensor tone and only active movement laterally attempts to move LE towards EOB.      Cervical / Trunk Assessment: Normal  Communication   Communication: Expressive difficulties  Cognition Arousal/Alertness: Awake/alert Behavior During Therapy: Flat affect Overall Cognitive Status: Difficult to assess                 General Comments: pt seems to follow most simple directions, but does seem to need step-by-step cueing.  pt attempts to verbalize, but unintelligible.      General  Comments      Exercises        Assessment/Plan    PT Assessment Patient needs continued PT services  PT Diagnosis Difficulty walking;Hemiplegia dominant side   PT Problem List Decreased strength;Decreased activity tolerance;Decreased balance;Decreased mobility;Decreased coordination;Decreased cognition;Decreased knowledge of use of DME;Decreased safety awareness;Impaired tone;Obesity  PT Treatment Interventions DME instruction;Gait training;Functional mobility training;Therapeutic activities;Therapeutic exercise;Balance training;Neuromuscular re-education;Cognitive remediation;Patient/family education   PT Goals (Current goals can be found in the Care Plan section) Acute Rehab PT Goals Patient  Stated Goal: Per husband for pt to talk and get around a little.   PT Goal Formulation: With family Time For Goal Achievement: 12/28/14 Potential to Achieve Goals: Good    Frequency Min 3X/week   Barriers to discharge        Co-evaluation               End of Session Equipment Utilized During Treatment: Gait belt;Oxygen Activity Tolerance: Patient limited by fatigue Patient left: in chair;with call bell/phone within reach;with family/visitor present Nurse Communication: Mobility status;Need for lift equipment         Time: [id number] PT Time Calculation (min) (ACUTE ONLY): 28 min   Charges:   PT Evaluation $Initial PT Evaluation Tier I: 1 Procedure PT Treatments $Therapeutic Activity: 8-22 mins   PT G CodesCatarina Hartshorn, [place] [phone] 12/14/2014, 3:12 PM

## 2014-12-14 NOTE — Progress Notes (Signed)
Patient ID: Tanya King, female   DOB: [birth year], 64 y.o.   MRN: [id number] Subjective:  The patient is alert and pleasant and in no apparent distress.  Objective: Vital signs in last 24 hours: Temp:  [97 F (36.1 C)-97.8 F (36.6 C)] 97.7 F (36.5 C) (02/24 1158) Pulse Rate:  [46-62] 49 (02/24 1200) Resp:  [12-20] 16 (02/24 1200) BP: (80-165)/(38-75) 152/54 mmHg (02/24 1200) SpO2:  [92 %-99 %] 94 % (02/24 1200)  Intake/Output from previous day: 02/23 0701 - 02/24 0700 In: 2140 [P.O.:240; I.V.:1800; IV Piggyback:100] Out: -  Intake/Output this shift: Total I/O In: 250 [I.V.:150; IV Piggyback:100] Out: -   Physical exam the patient is alert and pleasant. She remains dysarthric. She is right hemiparetic more so in the upper extremity than the lower extremity. Her pupils are equal.  Lab Results: No results for input(s): WBC, HGB, HCT, PLT in the last 72 hours. BMET No results for input(s): NA, K, CL, CO2, GLUCOSE, BUN, CREATININE, CALCIUM in the last 72 hours.  Studies/Results: Ct Head Wo Contrast  12/13/2014   CLINICAL DATA:  64 year old female status post brain tumor biopsy yesterday. Initial encounter.  EXAM: CT HEAD WITHOUT CONTRAST  TECHNIQUE: Contiguous axial images were obtained from the base of the skull through the vertex without intravenous contrast.  COMPARISON:  Preoperative brain MRI 12/17/2014, and earlier.  FINDINGS: Visualized paranasal sinuses and mastoids are clear. Left frontal burr hole with overlying postoperative scalp soft tissue changes and skin staples. Elsewhere Stable visualized osseous structures. Negative orbits soft tissues.  Calcified atherosclerosis at the skull base. A small volume of hemorrhage is noted within the biopsy tract which terminates at the left deep gray matter. There a gas and blood containing 15-18 mm biopsy cavity is present. Decreased left deep gray matter parenchymal hyperdensity and mass effect since 12/07/2014. Mildly decreased  hypodensity in the vasogenic edema pattern. Rightward midline shift has mildly decreased to 4 mm. No ventriculomegaly or intraventricular hemorrhage. No acute cortically based infarct.  IMPRESSION: 1. Sequelae of left frontal approach brain biopsy into the left deep gray matter nuclei, with small volume intra-axial hemorrhage. 2. Regressed left basal ganglia hyperdensity and mass effect since 12/07/2014.   Electronically Signed   By: Genevie Ann M.D.   On: 12/13/2014 07:58   Mr Jeri Cos BM Contrast  12/18/2014   CLINICAL DATA:  Continued surveillance intracranial mass lesions. Preoperative exam. RIGHT hemiparesis. Mental status changes.  EXAM: MRI HEAD WITHOUT AND WITH CONTRAST  TECHNIQUE: Multiplanar, multiecho pulse sequences of the brain and surrounding structures were obtained without and with intravenous contrast.  CONTRAST:  MultiHance 20 mL.  COMPARISON:  MR brain 12/08/2014.  FINDINGS: The patient was unable to remain motionless for the exam. Small or subtle lesions could be overlooked. Redemonstrated are multiple enhancing lesions throughout both cerebral hemispheres, LEFT greater than RIGHT, particularly affecting the LEFT basal ganglia. Some of the lesions demonstrate restricted diffusion, are T2 hypointense, and are periventricular in location. There is a moderate amount of associated vasogenic edema with slight LEFT-to-RIGHT midline shift of 3 mm. No new lesions have developed since priors. No dominant meningeal enhancement. Upper cervical region unremarkable. Extracranial soft tissues within normal limits.  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 No new lesions have developed since prior MR. Slight improvement LEFT-to-RIGHT shift following steroid administration.   Electronically Signed   By: Rolla Flatten M.D.   On: 12/01/2014 14:47    Assessment/Plan: Postop day #2: The patient is  neurologically stable. I think she would benefit from rehabilitation. I will asked the  rehabilitation team to see her. I will transfer to the floor. I spoke with the patient's husband.  LOS: 2 days     Kamron Vanwyhe D 12/14/2014, 12:54 PM

## 2014-12-14 NOTE — Consult Note (Signed)
Physical Medicine and Rehabilitation Consult Reason for Consult: Multiple intracranial lesions Referring Physician: Dr. Arnoldo Morale   HPI: Tanya King is a 64 y.o. right handed female with history of hypertension, tobacco abuse. Patient independent with shuffling gait living with her husband prior to admission with also noted short-term memory problems. Admitted 11/22/2014 with altered mental status right hemiparesis, dysarthria with apraxia that progressed over the past month.  CT MRI demonstrated multiple intracranial lesions. A metastatic workup at that time was negative. Underwent a left stereotactic brain biopsy 12/06/2014 per Dr. Arnoldo Morale. Biopsy is pending. Follow-up oncology services Dr. Marin Olp and await plan of care. Decadron protocol as indicated. Maintained on Keppra for seizure prophylaxis. Currently with a dysphagia 1 nectar thick liquid diet. Physical and occupational therapy evaluations are pending. M.D. has requested physical medicine rehabilitation consult.   Review of Systems  Constitutional:       Unsteady gait  Gastrointestinal:       GERD  Neurological: Positive for dizziness and headaches.  Psychiatric/Behavioral: Positive for memory loss.  All other systems reviewed and are negative.  Past Medical History  Diagnosis Date  . Asthma   . Hypercholesteremia   . GERD (gastroesophageal reflux disease)   . Sleep apnea     AHI 116/hr now on BiPAP at 25/1mmHg  . Edema   . HTN (hypertension)   . Allergic rhinitis   . Vitamin B12 deficiency   . RLS (restless legs syndrome)   . Vitamin deficiency   . Obesity   . Aortic stenosis   . Edema     chronic LE edema  . Heart murmur     (mild AV stenosis with no regurg, mild MV sclerosis with mild MR, mod LVH 2012)  . Carotid artery occlusion     <30% bilateral ICA stenosis, no flow in left vertebral artery  . COPD (chronic obstructive pulmonary disease)     per spouse not diagnosed   Past Surgical History    Procedure Laterality Date  . Tubal ligation    . Craniotomy N/A 11/28/2014    Procedure: LEFT BRAIN BIOPSY w/ Curve;  Surgeon: Newman Pies, MD;  Location: [facility];  Service: Neurosurgery;  Laterality: N/A;  brain biopsy with brain lab   Family History  Problem Relation Age of Onset  . Parkinson's disease Father   . Lung cancer Brother   . Diabetes Brother   . Heart attack Brother   . Diabetes Mother   . Hyperlipidemia Mother    Social History:  reports that she has been smoking Cigarettes.  She has a 42 pack-year smoking history. She does not have any smokeless tobacco history on file. She reports that she does not drink alcohol or use illicit drugs. Allergies:  Allergies  Allergen Reactions  . Lisinopril     Feels like she is burning up  . Shellfish Allergy Rash   Medications Prior to Admission  Medication Sig Dispense Refill  . albuterol (PROVENTIL HFA;VENTOLIN HFA) 108 (90 BASE) MCG/ACT inhaler Inhale 2 puffs into the lungs every 6 (six) hours as needed. wheezing    . Cholecalciferol (VITAMIN D) 2000 UNITS tablet Take 2,000 Units by mouth daily.    . CRESTOR 10 MG tablet Take 1 tablet by mouth every evening.  0  . dexamethasone (DECADRON) 4 MG tablet Take 2 tablets (8 mg total) by mouth every 8 (eight) hours. 30 tablet 0  . fluticasone-salmeterol (ADVAIR HFA) 115-21 MCG/ACT inhaler Inhale 2 puffs into the  lungs 2 (two) times daily.    Marland Kitchen losartan-hydrochlorothiazide (HYZAAR) 100-12.5 MG per tablet Take 1 tablet by mouth daily.  0    Home: Home Living Family/patient expects to be discharged to:: Private residence Living Arrangements: Spouse/significant other  Functional History:   Functional Status:  Mobility:          ADL:    Cognition: Cognition Orientation Level: Oriented to person, Disoriented to time, Disoriented to situation, Oriented to place    Blood pressure 152/54, pulse 49, temperature 97.7 F (36.5 C), temperature source Oral, resp. rate  16, height 5\' 6"  (1.676 m), weight 120.901 kg (266 lb 8.6 oz), SpO2 94 %. Physical Exam  HENT:  Stereotactic biopsy incision clean and dry  Eyes:  Pupils round and reactive to light without nystagmus  Neck: Normal range of motion. Neck supple. No thyromegaly present.  Cardiovascular: Normal rate and regular rhythm.   Respiratory: Effort normal and breath sounds normal. No respiratory distress.  GI: Soft. Bowel sounds are normal. She exhibits no distension.  Neurological:  Lethargic but arousable. She did provide her name and age but would fall back asleep during exam. Followed simple commands    No results found for this or any previous visit (from the past 24 hour(s)). Ct Head Wo Contrast  12/13/2014   CLINICAL DATA:  64 year old female status post brain tumor biopsy yesterday. Initial encounter.  EXAM: CT HEAD WITHOUT CONTRAST  TECHNIQUE: Contiguous axial images were obtained from the base of the skull through the vertex without intravenous contrast.  COMPARISON:  Preoperative brain MRI 11/22/2014, and earlier.  FINDINGS: Visualized paranasal sinuses and mastoids are clear. Left frontal burr hole with overlying postoperative scalp soft tissue changes and skin staples. Elsewhere Stable visualized osseous structures. Negative orbits soft tissues.  Calcified atherosclerosis at the skull base. A small volume of hemorrhage is noted within the biopsy tract which terminates at the left deep gray matter. There a gas and blood containing 15-18 mm biopsy cavity is present. Decreased left deep gray matter parenchymal hyperdensity and mass effect since 12/07/2014. Mildly decreased hypodensity in the vasogenic edema pattern. Rightward midline shift has mildly decreased to 4 mm. No ventriculomegaly or intraventricular hemorrhage. No acute cortically based infarct.  IMPRESSION: 1. Sequelae of left frontal approach brain biopsy into the left deep gray matter nuclei, with small volume intra-axial hemorrhage. 2.  Regressed left basal ganglia hyperdensity and mass effect since 12/07/2014.   Electronically Signed   By: Genevie Ann M.D.   On: 12/13/2014 07:58   Mr Jeri Cos KC Contrast  12/13/2014   CLINICAL DATA:  Continued surveillance intracranial mass lesions. Preoperative exam. RIGHT hemiparesis. Mental status changes.  EXAM: MRI HEAD WITHOUT AND WITH CONTRAST  TECHNIQUE: Multiplanar, multiecho pulse sequences of the brain and surrounding structures were obtained without and with intravenous contrast.  CONTRAST:  MultiHance 20 mL.  COMPARISON:  MR brain 12/08/2014.  FINDINGS: The patient was unable to remain motionless for the exam. Small or subtle lesions could be overlooked. Redemonstrated are multiple enhancing lesions throughout both cerebral hemispheres, LEFT greater than RIGHT, particularly affecting the LEFT basal ganglia. Some of the lesions demonstrate restricted diffusion, are T2 hypointense, and are periventricular in location. There is a moderate amount of associated vasogenic edema with slight LEFT-to-RIGHT midline shift of 3 mm. No new lesions have developed since priors. No dominant meningeal enhancement. Upper cervical region unremarkable. Extracranial soft tissues within normal limits.  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 No new lesions  have developed since prior MR. Slight improvement LEFT-to-RIGHT shift following steroid administration.   Electronically Signed   By: Rolla Flatten M.D.   On: 12/06/2014 14:47    Assessment/Plan: Diagnosis: CNS lymphoma? 1. Does the need for close, 24 hr/day medical supervision in concert with the patient's rehab needs make it unreasonable for this patient to be served in a less intensive setting? Yes 2. Co-Morbidities requiring supervision/potential complications: see above 3. Due to bladder management, bowel management, safety, skin/wound care, disease management, medication administration, pain management and patient education,  does the patient require 24 hr/day rehab nursing? Yes 4. Does the patient require coordinated care of a physician, rehab nurse, PT (1-2 hrs/day, 5 days/week) and OT (1-2 hrs/day, 5 days/week) to address physical and functional deficits in the context of the above medical diagnosis(es)? Yes Addressing deficits in the following areas: balance, endurance, locomotion, strength, transferring, bowel/bladder control, bathing, dressing, feeding, grooming and toileting 5. Can the patient actively participate in an intensive therapy program of at least 3 hrs of therapy per day at least 5 days per week? Yes 6. The potential for patient to make measurable gains while on inpatient rehab is excellent 7. Anticipated functional outcomes upon discharge from inpatient rehab are supervision  with PT, supervision with OT, n/a with SLP. 8. Estimated rehab length of stay to reach the above functional goals is: 15-20 days 9. Does the patient have adequate social supports and living environment to accommodate these discharge functional goals? Yes 10. Anticipated D/C setting: Home 11. Anticipated post D/C treatments: HH therapy and Outpatient therapy 12. Overall Rehab/Functional Prognosis: excellent  RECOMMENDATIONS: This patient's condition is appropriate for continued rehabilitative care in the following setting: CIR Patient has agreed to participate in recommended program. Yes Note that insurance prior authorization may be required for reimbursement for recommended care.  Comment: Rehab Admissions Coordinator to follow up.  Thanks,  Meredith Staggers, MD, Mellody Drown     12/14/2014

## 2014-12-14 NOTE — Consult Note (Signed)
Referral MD  Reason for Referral: Multiple brain lesions   No chief complaint on file. : I just had brain surgery  HPI: Tanya King is a very charming 64 year old white female. I saw her last week. She was over at [facility]. She present with some dizziness and some confusion. She found have multiple brain lesions. A systemic workup was unremarkable for any obvious primary site. She had previously had a nodule that had been seen in the lung. This was unchanged in size on her scan. She had a previous PET scan which did not show any activity in this nodule.  She was admitted back to [facility] this past Monday. She's taking the surgery. She had a biopsy of a basal ganglia lesion. There was some slight bleeding noted afterwards.  She really cannot move her right arm. She has some weakness on her right leg. She seemed to understand what you're telling her. She gives some simple responses.  The pathology report is not back yet.   Past Medical History  Diagnosis Date  . Asthma   . Hypercholesteremia   . GERD (gastroesophageal reflux disease)   . Sleep apnea     AHI 116/hr now on BiPAP at 25/10mmHg  . Edema   . HTN (hypertension)   . Allergic rhinitis   . Vitamin B12 deficiency   . RLS (restless legs syndrome)   . Vitamin deficiency   . Obesity   . Aortic stenosis   . Edema     chronic LE edema  . Heart murmur     (mild AV stenosis with no regurg, mild MV sclerosis with mild MR, mod LVH 2012)  . Carotid artery occlusion     <30% bilateral ICA stenosis, no flow in left vertebral artery  . COPD (chronic obstructive pulmonary disease)     per spouse not diagnosed  :  Past Surgical History  Procedure Laterality Date  . Tubal ligation    . Craniotomy N/A 12/09/2014    Procedure: LEFT BRAIN BIOPSY w/ Curve;  Surgeon: Newman Pies, MD;  Location: [place] [facility];  Service: Neurosurgery;  Laterality: N/A;  brain biopsy with brain lab  :   Current facility-administered  medications:  .  0.9 % NaCl with KCl 20 mEq/ L  infusion, , Intravenous, Continuous, Newman Pies, MD, Last Rate: 75 mL/hr at 12/08/2014 2216 .  acetaminophen (TYLENOL) tablet 650 mg, 650 mg, Oral, Q4H PRN **OR** acetaminophen (TYLENOL) suppository 650 mg, 650 mg, Rectal, Q4H PRN, Newman Pies, MD .  albuterol (PROVENTIL) (2.5 MG/3ML) 0.083% nebulizer solution 2.5 mg, 2.5 mg, Nebulization, Q6H PRN, Newman Pies, MD .  antiseptic oral rinse (CPC / CETYLPYRIDINIUM CHLORIDE 0.05%) solution 7 mL, 7 mL, Mouth Rinse, BID, Newman Pies, MD, 7 mL at 12/13/14 2200 .  dexamethasone (DECADRON) injection 8 mg, 8 mg, Intravenous, Q8H, Elaina Hoops, MD, 8 mg at 12/14/14 [phone] .  docusate sodium (COLACE) capsule 100 mg, 100 mg, Oral, BID, Newman Pies, MD, Stopped at 12/13/14 [phone] .  fentaNYL (SUBLIMAZE) injection 25-50 mcg, 25-50 mcg, Intravenous, Q5 min PRN, Nickie Retort, MD .  losartan (COZAAR) tablet 100 mg, 100 mg, Oral, Daily, Stopped at 12/13/14 0849 **AND** hydrochlorothiazide (MICROZIDE) capsule 12.5 mg, 12.5 mg, Oral, Daily, Newman Pies, MD, Stopped at 12/13/14 [phone] .  HYDROcodone-acetaminophen (NORCO/VICODIN) 5-325 MG per tablet 1 tablet, 1 tablet, Oral, Q4H PRN, Newman Pies, MD .  labetalol (NORMODYNE,TRANDATE) injection 10-40 mg, 10-40 mg, Intravenous, Q10 min PRN, Newman Pies, MD .  lactated ringers infusion, , Intravenous, Continuous, Nickie Retort, MD, Last Rate: 50 mL/hr at 12/18/2014 0959, 50 mL/hr at 11/23/2014 0959 .  levETIRAcetam (KEPPRA) IVPB 500 mg/100 mL premix, 500 mg, Intravenous, Q12H, Newman Pies, MD, 500 mg at 12/13/14 2059 .  meperidine (DEMEROL) injection 6.25-12.5 mg, 6.25-12.5 mg, Intravenous, Q5 min PRN, Nickie Retort, MD .  mometasone-formoterol [facility]) 100-5 MCG/ACT inhaler 2 puff, 2 puff, Inhalation, BID, Newman Pies, MD, 2 puff at 12/13/14 1937 .  morphine 2 MG/ML injection 1-2 mg, 1-2 mg, Intravenous, Q2H PRN, Newman Pies, MD .   ondansetron [facility]) tablet 4 mg, 4 mg, Oral, Q4H PRN **OR** ondansetron (ZOFRAN) injection 4 mg, 4 mg, Intravenous, Q4H PRN, Newman Pies, MD .  pantoprazole (PROTONIX) injection 40 mg, 40 mg, Intravenous, QHS, Newman Pies, MD, 40 mg at 12/13/14 2100 .  promethazine (PHENERGAN) injection 6.25-12.5 mg, 6.25-12.5 mg, Intravenous, Q15 min PRN, Nickie Retort, MD .  promethazine (PHENERGAN) tablet 12.5-25 mg, 12.5-25 mg, Oral, Q4H PRN, Newman Pies, MD .  RESOURCE THICKENUP CLEAR, , Oral, PRN, Newman Pies, MD .  rosuvastatin (CRESTOR) tablet 10 mg, 10 mg, Oral, QPM, Newman Pies, MD, 10 mg at 12/13/14 1844:  . antiseptic oral rinse  7 mL Mouth Rinse BID  . dexamethasone  8 mg Intravenous Q8H  . docusate sodium  100 mg Oral BID  . losartan  100 mg Oral Daily   And  . hydrochlorothiazide  12.5 mg Oral Daily  . levETIRAcetam  500 mg Intravenous Q12H  . mometasone-formoterol  2 puff Inhalation BID  . pantoprazole (PROTONIX) IV  40 mg Intravenous QHS  . rosuvastatin  10 mg Oral QPM  :  Allergies  Allergen Reactions  . Lisinopril     Feels like she is burning up  . Shellfish Allergy Rash  :  Family History  Problem Relation Age of Onset  . Parkinson's disease Father   . Lung cancer Brother   . Diabetes Brother   . Heart attack Brother   . Diabetes Mother   . Hyperlipidemia Mother   :  History   Social History  . Marital Status: Married    Spouse Name: N/A  . Number of Children: N/A  . Years of Education: N/A   Occupational History  . Not on file.   Social History Main Topics  . Smoking status: Current Every Day Smoker -- 1.00 packs/day for 42 years    Types: Cigarettes  . Smokeless tobacco: Not on file  . Alcohol Use: No  . Drug Use: No  . Sexual Activity: Not on file   Other Topics Concern  . Not on file   Social History Narrative  :  Pertinent items are noted in HPI.  Exam: Patient Vitals for the past 24 hrs:  BP Temp Temp src Pulse Resp  SpO2  12/14/14 0700 (!) 163/66 mmHg - - (!) 49 14 97 %  12/14/14 0600 (!) 160/59 mmHg - - (!) 52 12 97 %  12/14/14 0500 (!) 164/70 mmHg - - (!) 51 17 96 %  12/14/14 0400 (!) 165/67 mmHg 97.7 F (36.5 C) Oral (!) 48 15 95 %  12/14/14 0300 (!) 154/61 mmHg - - (!) 49 15 93 %  12/14/14 0200 (!) 154/64 mmHg - - (!) 49 15 94 %  12/14/14 0100 (!) 158/67 mmHg - - (!) 46 14 92 %  12/14/14 0000 (!) 144/61 mmHg 97.5 F (36.4 C) Oral (!) 50 16 94 %  12/13/14 2300 (!)  130/54 mmHg - - (!) 49 15 95 %  12/13/14 2200 (!) 136/50 mmHg - - (!) 50 16 95 %  12/13/14 2100 (!) 151/57 mmHg - - (!) 51 15 95 %  12/13/14 2000 (!) 137/53 mmHg 97.8 F (36.6 C) Oral (!) 51 17 93 %  12/13/14 1939 - - - - - 97 %  12/13/14 1900 (!) 80/52 mmHg - - (!) 52 16 92 %  12/13/14 1800 111/75 mmHg - - (!) 55 17 97 %  12/13/14 1700 (!) 134/51 mmHg - - (!) 50 15 96 %  12/13/14 1600 (!) 145/53 mmHg - - (!) 55 20 99 %  12/13/14 1533 - 97 F (36.1 C) Oral - - -  12/13/14 1500 (!) 143/47 mmHg - - (!) 50 16 95 %  12/13/14 1400 (!) 90/38 mmHg - - (!) 49 16 94 %  12/13/14 1300 (!) 97/53 mmHg - - (!) 48 14 94 %  12/13/14 1100 (!) 93/52 mmHg 97.3 F (36.3 C) - (!) 52 17 96 %  12/13/14 1000 92/62 mmHg - - (!) 50 17 91 %  12/13/14 0900 (!) 102/59 mmHg - - (!) 53 16 93 %  12/13/14 0817 - - - - - 98 %  12/13/14 0800 (!) 99/55 mmHg 97.4 F (36.3 C) - (!) 49 19 95 %    obese white female. Her physical exam is premature unremarkable outside of the weakness on the right arm. She has the dressing in the left frontal region of the scalp. Eyes react. There is no oral lesions. There is no thrush. Lungs are clear. Cardiac exam regular rate and rhythm. Abdomen is soft, obese. No fluid wave. No palpable liver or spleen tip. Skin exam shows no rashes. Neurological exam shows the focal neurological deficits described previously.   No results for input(s): WBC, HGB, HCT, PLT in the last 72 hours. No results for input(s): NA, K, CL, CO2, GLUCOSE, BUN,  CREATININE, CALCIUM in the last 72 hours.  Blood smear review: None  Pathology: Pending     Assessment and Plan: Ms. Tanya King is a 64 year old white female. She has multiple brain lesions.  Hopefully, the biopsy will be out today.  One would have to think that she may have a primary brain malignancy-the likely possibility would be CNS lymphoma. A multifocal glioblastoma would be unusual but not out of the possibility.  Given that she had some bleeding with the biopsy, a vascular melanoma metastasis could be considered. Again she's never had any skin lesions removed. However, about 5% of melanomas can present with metastatic disease without a cutaneous primary.  I feel bad that she has the neurological deficit right now. Hopefully, this will improve with improvement in the bleeding. She is on steroids.  Her husband was with her this morning. As all his, we had an excellent prayer session. She has a very strong faith.  Once we have the path report back, then we will see what the next step is. If it is lymphoma, then primary chemotherapy would be the treatment. If it is anything else, then radiation therapy would be the preferred option.  I very much appreciate Dr. Arnoldo Morale help and the rapid response that he gave her to do the biopsy so quickly.!!!  Lum Keas.  Ephesians 4:32

## 2014-12-15 MED ORDER — DEXAMETHASONE 4 MG PO TABS: 4.0000 mg | ORAL_TABLET | Freq: Three times a day (TID) | ORAL | Status: AC

## 2014-12-15 MED ORDER — LEVETIRACETAM 500 MG PO TABS: 500.0000 mg | ORAL_TABLET | Freq: Two times a day (BID) | ORAL | Status: AC

## 2014-12-15 NOTE — Evaluation (Addendum)
Occupational Therapy Evaluation Patient Details Name: Tanya King MRN: [id number] DOB: 1951-10-01 Today's Date: 12/15/2014    History of Present Illness This 64 y.o. femlae admitted 11/30/2014 with AMS, Rt hemiparesis, dyarthria and apraxia.  MRI showed multiple intracranial lesions.  Metastatic worku p done thus far has been negative.  She underwent stereotatic brain biopsy 11/26/2014 with pathology results pending.  PMH includes: aortic stenosis; asthma; HTN   Clinical Impression   Pt admitted with above. She demonstrates the below listed deficits and will benefit from continued OT to maximize safety and independence with BADLs.  Pt presents to OT with Rt hemiplegia, edema Rt UE, decreased trunk control, ideomotor apraxia,  and communication deficits.  She requires mod - total A for BADLs and functional mobility.  Feel she would benefit from CIR to allow her to regain maximal independence with ADLs.  Will follow acutely       Follow Up Recommendations  CIR;Supervision/Assistance - 24 hour    Equipment Recommendations  3 in 1 bedside comode;Wheelchair (measurements OT);Hospital bed    Recommendations for Other Services Rehab consult     Precautions / Restrictions Precautions Precautions: Fall Restrictions Weight Bearing Restrictions: No      Mobility Bed Mobility Overal bed mobility: Needs Assistance;+2 for physical assistance Bed Mobility: Supine to Sit;Sit to Supine     Supine to sit: Max assist Sit to supine: Mod assist   General bed mobility comments: Pt ablet to initiate rolling and supine to sit.  Max A for rolling to Lt and min A to Rt.  She requires assist to move LEs off bed and to lift trunk.  She was able to assist with lifting LEs back on to bed   Transfers Overall transfer level: Needs assistance               General transfer comment: Unable to perform safely with +1 assist     Balance Overall balance assessment: Needs assistance Sitting-balance  support: Feet supported Sitting balance-Leahy Scale: Fair Sitting balance - Comments: Pt initially requiring min A, but progressed to min guard assist EOB                                     ADL Overall ADL's : Needs assistance/impaired Eating/Feeding: Bed level;Minimal assistance   Grooming: Oral care;Wash/dry face;Wash/dry hands;Minimal assistance;Sitting   Upper Body Bathing: Moderate assistance;Bed level;Sitting   Lower Body Bathing: Total assistance;Bed level   Upper Body Dressing : Maximal assistance;Sitting   Lower Body Dressing: Total assistance;Bed level   Toilet Transfer: Total assistance Toilet Transfer Details (indicate cue type and reason): unable to perform safely with +1 assist  Toileting- Clothing Manipulation and Hygiene: Total assistance;Bed level Toileting - Clothing Manipulation Details (indicate cue type and reason): Pt incontinent of urine x 2 during OT eval.  Pt assisted with clean up in supine      Functional mobility during ADLs: Total assistance General ADL Comments: Pt able to follow functional commands in the context of the task.       Vision Vision Assessment?: Yes Eye Alignment: Within Functional Limits Ocular Range of Motion: Within Functional Limits Tracking/Visual Pursuits: Able to track stimulus in all quads without difficulty Additional Comments: Pt unable to follow complex commands for visual field testing.      Perception Perception Perception Tested?: Yes   Praxis Praxis Praxis tested?: Deficits Deficits: Ideomotor    Pertinent Vitals/Pain Pain  Assessment: No/denies pain Faces Pain Scale: Hurts little more Pain Location: back, pt pointing to lower left back Pain Intervention(s): Repositioned;Monitored during session     Hand Dominance Right   Extremity/Trunk Assessment Upper Extremity Assessment Upper Extremity Assessment: RUE deficits/detail RUE Deficits / Details: min edema noted. Pt unable to move Rt UE  actively when asked.  While seated EOB pt with minimal shoulder movement noted spontaneously  RUE Coordination: decreased fine motor;decreased gross motor   Lower Extremity Assessment Lower Extremity Assessment: Defer to PT evaluation   Cervical / Trunk Assessment Cervical / Trunk Assessment: Other exceptions Cervical / Trunk Exceptions: Pt with weakness noted Rt trunk    Communication Communication Communication: Expressive difficulties;Receptive difficulties   Cognition Arousal/Alertness: Awake/alert Behavior During Therapy: Flat affect Overall Cognitive Status: Difficult to assess                 General Comments: Pt does follow one step functional commands in context of activity    General Comments       Exercises       Shoulder Instructions      Home Living Family/patient expects to be discharged to:: Inpatient rehab Living Arrangements: Spouse/significant other Available Help at Discharge: Family;Available 24 hours/day Type of Home: House Home Access: Stairs to enter [employer] of Steps: 4 Entrance Stairs-Rails: Left Home Layout: One level     Bathroom Shower/Tub: Tub/shower unit Shower/tub characteristics: Curtain Biochemist, clinical: Standard     Home Equipment: Environmental consultant - 2 wheels;Walker - 4 wheels;Bedside commode;Cane - single point   Additional Comments: Information gathered via chart review as pt unable to provide info      Prior Functioning/Environment Level of Independence: Independent        Comments: Over last few weeks pt had been having memory and R hand deficits.      OT Diagnosis: Generalized weakness;Hemiplegia dominant side;Apraxia   OT Problem List: Decreased strength;Decreased range of motion;Decreased activity tolerance;Impaired balance (sitting and/or standing);Decreased coordination;Decreased knowledge of use of DME or AE;Decreased safety awareness;Cardiopulmonary status limiting activity;Obesity;Impaired UE functional  use;Impaired tone;Increased edema   OT Treatment/Interventions: Self-care/ADL training;Neuromuscular education;DME and/or AE instruction;Manual therapy;Splinting;Therapeutic activities;Patient/family education;Balance training    OT Goals(Current goals can be found in the care plan section) Acute Rehab OT Goals Patient Stated Goal: Pt unable  OT Goal Formulation: Patient unable to participate in goal setting Time For Goal Achievement: 12/29/14 Potential to Achieve Goals: Good ADL Goals Pt Will Perform Grooming: with supervision;with set-up;sitting Pt Will Perform Upper Body Bathing: with min assist;sitting (EOB) Pt Will Transfer to Toilet: with mod assist;squat pivot transfer;bedside commode Additional ADL Goal #1: Pt will perform self ROM rt UE with min A   OT Frequency: Min 2X/week   Barriers to D/C:            Co-evaluation              End of Session Equipment Utilized During Treatment: Oxygen Nurse Communication: Mobility status  Activity Tolerance: Patient tolerated treatment well Patient left: in bed;with call bell/phone within reach;with bed alarm set   Time: 2542-7062 OT Time Calculation (min): 36 min Charges:  OT General Charges $OT Visit: 1 Procedure OT Evaluation $Initial OT Evaluation Tier I: 1 Procedure OT Treatments $Self Care/Home Management : 8-22 mins G-Codes:    Angelly Spearing M 01/10/2015, 2:30 PM

## 2014-12-15 NOTE — Progress Notes (Signed)
Pt's husband was repositioning her in bed when IV was removed. Bleeding noted. Pressure applied with dry dressing. Attempted to restart but was unsuccessful. IV team consult order placed.

## 2014-12-15 NOTE — Progress Notes (Signed)
Speech Language Pathology Treatment: Dysphagia  Patient Details Name: Tanya King MRN: [id number] DOB: 1951/03/17 Today's Date: 12/15/2014 Time: 1120-1129 SLP Time Calculation (min) (ACUTE ONLY): 9 min  Assessment / Plan / Recommendation Clinical Impression  Pt currently sleeping - appears more sleepy to this SLP today than yesterday.  Spouse reports pt wakes mostly during meals but sleeps frequently throughout the day.  Due to pt's current lethargy, did not provide pt with oral intake.  Spouse reports he makes sure pt swallows prior to giving more and is using extra caution with liquids as pt had some coughing with V8 juice x1.  He further states using oral suction after meals as a precaution but not much as been removed from mouth with suction.    Recommend continue current diet due to pt's ongoing dysarthria, dysphagia and sleepiness.  Will follow for readiness for dietary advancement and family/pt education.  Skilled intervention included re-educating pt/spouse to swallow precautions/diet modifier and modifying compensation strategies to maximize pt safety/swallow efficiency.    Note pt mouth breathing and therefore recommended pt begin meals with liquids to moisten prior to intake and prevent adherence of food to oral tissues.  Spouse agreeable to plan.    Pt is afebrile currently with lungs clear but decreased.  Spouse is very intuned to his wife's needs and using caution with intake and assuring to only feed her when fully alert.   MD please order speech language evaluation to help maximize pt's functional communication due to her aphasia/dysarthria.  She is able to state single words/phrases inconsistently.       HPI HPI: Pt is a 64 yo female with recent admit to [facility] with right lower extremity weakness and slurred speech x2-3 weeks.  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  Pt dc'd home over the weekend and returned for brain biopsy to [facility] on Monday 11/24/2014.  Pt with  mild bleed s/p brain bx with resultant dysarthria, increased right sided weakness, left hemianopsia and swallow evaluation ordered.  PMH + for smoking, asthma, HTN.  Pt was seen by SLP for speech evaluation during [facility] hospital admission.     Pertinent Vitals Pain Assessment: Faces Faces Pain Scale: Hurts little more Pain Location: back, pt pointing to lower left back Pain Intervention(s): Repositioned;Monitored during session  SLP Plan  Continue with current plan of care    Recommendations Diet recommendations: Dysphagia 1 (puree);Nectar-thick liquid Liquids provided via: Cup;Teaspoon Medication Administration: Crushed with puree Supervision: Full supervision/cueing for compensatory strategies;Trained caregiver to feed patient Compensations: Slow rate;Small sips/bites;Check for pocketing (start meal with liquids) Postural Changes and/or Swallow Maneuvers: Seated upright 90 degrees;Upright 30-60 min after meal              Oral Care Recommendations: Oral care before and after PO Follow up Recommendations: Inpatient Rehab Plan: Continue with current plan of care    [place], [place], [place] [facility] SLP [phone]

## 2014-12-15 NOTE — Progress Notes (Signed)
Rehab admissions - I met with pt and her husband in follow up to rehab MD consult to explain the possibility of inpatient rehab. Pt had difficulty engaging in the conversation and fell asleep part way through the discussion. Further information was given about our rehab program and informational brochures were given. Husband is supportive of pursuing inpatient rehab.  Pt has [employer] and we will need insurance approval to consider a possible rehab admission.  I will open her case with [facility] and keep the pt/family and medical team aware of any updates.   Please call me with any questions. Thanks.  Nanetta Batty, PT Rehabilitation Admissions Coordinator [phone]

## 2014-12-15 NOTE — Discharge Summary (Signed)
  Physician Discharge Summary  Patient ID: Tanya King MRN: [id number] DOB/AGE: [birth year] 62 y.o.  Admit date: 12/11/2014 Discharge date: 12/15/2014  Admission Diagnoses: Brain tumors  Discharge Diagnoses: The same Active Problems:   Brain tumor   Discharged Condition: fair  Hospital Course: The patient was admitted on 12/04/2014 with a diagnosis of a brain tumor. Metastatic workup was negative. She underwent a stereotactic brain biopsy on 12/08/2014. The patient had a small hemorrhage in the biopsy site.  The patient's postoperative course was unremarkable. She is a bit more dysarthric and right hemiparetic after her biopsy. We had PT, OT, speech therapy, and rehabilitation see the patient. Arrangements are being made for her to go into rehabilitation.  Consults: PT, OT, speech therapy, rehabilitation Significant Diagnostic Studies: Head CT, brain MRI Treatments: Left stereotactic brain biopsy Discharge Exam: Blood pressure 109/64, pulse 57, temperature 98.1 F (36.7 C), temperature source Oral, resp. rate 18, height 5\' 6"  (1.676 m), weight 120.901 kg (266 lb 8.6 oz), SpO2 92 %. The patient is alert and pleasant. She is dysarthric. She is right hemiparetic worse so in the upper extremity than the lower extremity. Her biopsy incision is healing well.  Disposition: Rehabilitation     Medication List    TAKE these medications        albuterol 108 (90 BASE) MCG/ACT inhaler  Commonly known as:  PROVENTIL HFA;VENTOLIN HFA  Inhale 2 puffs into the lungs every 6 (six) hours as needed. wheezing     CRESTOR 10 MG tablet  Generic drug:  rosuvastatin  Take 1 tablet by mouth every evening.     dexamethasone 4 MG tablet  Commonly known as:  DECADRON  Take 1 tablet (4 mg total) by mouth 3 (three) times daily.     fluticasone-salmeterol 115-21 MCG/ACT inhaler  Commonly known as:  ADVAIR HFA  Inhale 2 puffs into the lungs 2 (two) times daily.     levETIRAcetam 500 MG tablet   Commonly known as:  KEPPRA  Take 1 tablet (500 mg total) by mouth 2 (two) times daily.     losartan-hydrochlorothiazide 100-12.5 MG per tablet  Commonly known as:  HYZAAR  Take 1 tablet by mouth daily.     Vitamin D 2000 UNITS tablet  Take 2,000 Units by mouth daily.         SignedOphelia Charter 12/15/2014, 10:52 AM

## 2014-12-16 ENCOUNTER — Inpatient Hospital Stay (HOSPITAL_COMMUNITY): Payer: 59

## 2014-12-16 ENCOUNTER — Encounter (HOSPITAL_COMMUNITY): Payer: Self-pay | Admitting: Radiology

## 2014-12-16 DIAGNOSIS — R471 Dysarthria and anarthria: Secondary | ICD-10-CM

## 2014-12-16 DIAGNOSIS — C8590 Non-Hodgkin lymphoma, unspecified, unspecified site: Secondary | ICD-10-CM | POA: Diagnosis present

## 2014-12-16 DIAGNOSIS — M6281 Muscle weakness (generalized): Secondary | ICD-10-CM

## 2014-12-16 DIAGNOSIS — C8380 Other non-follicular lymphoma, unspecified site: Secondary | ICD-10-CM

## 2014-12-16 LAB — BASIC METABOLIC PANEL
ANION GAP: 9 (ref 5–15)
BUN: 23 mg/dL (ref 6–23)
CHLORIDE: 103 mmol/L (ref 96–112)
CO2: 25 mmol/L (ref 19–32)
Calcium: 8.4 mg/dL (ref 8.4–10.5)
Creatinine, Ser: 0.92 mg/dL (ref 0.50–1.10)
GFR calc Af Amer: 76 mL/min — ABNORMAL LOW (ref >90)
GFR calc non Af Amer: 66 mL/min — ABNORMAL LOW (ref >90)
Glucose, Bld: 166 mg/dL — ABNORMAL HIGH (ref 70–99)
POTASSIUM: 4.5 mmol/L (ref 3.5–5.1)
Sodium: 137 mmol/L (ref 135–145)

## 2014-12-16 LAB — CBC
HEMATOCRIT: 52 % — AB (ref 36.0–46.0)
Hemoglobin: 18.1 g/dL — ABNORMAL HIGH (ref 12.0–15.0)
MCH: 31.6 pg (ref 26.0–34.0)
MCHC: 34.8 g/dL (ref 30.0–36.0)
MCV: 90.9 fL (ref 78.0–100.0)
Platelets: 112 10*3/uL — ABNORMAL LOW (ref 150–400)
RBC: 5.72 MIL/uL — ABNORMAL HIGH (ref 3.87–5.11)
RDW: 14.1 % (ref 11.5–15.5)
WBC: 13 10*3/uL — ABNORMAL HIGH (ref 4.0–10.5)

## 2014-12-16 LAB — APTT: aPTT: 27 seconds (ref 24–37)

## 2014-12-16 LAB — PROTIME-INR
INR: 1.09 (ref 0.00–1.49)
Prothrombin Time: 14.3 seconds (ref 11.6–15.2)

## 2014-12-16 MED ORDER — CEFAZOLIN SODIUM-DEXTROSE 2-3 GM-% IV SOLR: 2.0000 g | Freq: Once | INTRAVENOUS | Status: DC

## 2014-12-16 MED ORDER — LIDOCAINE-EPINEPHRINE (PF) 1 %-1:200000 IJ SOLN
INTRAMUSCULAR | Status: AC
  Filled 2014-12-16: qty 10

## 2014-12-16 MED ORDER — MIDAZOLAM HCL 2 MG/2ML IJ SOLN
INTRAMUSCULAR | Status: AC
  Filled 2014-12-16: qty 2

## 2014-12-16 MED ORDER — HEPARIN SOD (PORK) LOCK FLUSH 100 UNIT/ML IV SOLN
INTRAVENOUS | Status: AC
  Filled 2014-12-16: qty 5

## 2014-12-16 MED ORDER — CEFAZOLIN SODIUM-DEXTROSE 2-3 GM-% IV SOLR
2.0000 g | INTRAVENOUS | Status: AC
  Filled 2014-12-16: qty 50

## 2014-12-16 MED ORDER — FENTANYL CITRATE 0.05 MG/ML IJ SOLN
INTRAMUSCULAR | Status: AC
  Filled 2014-12-16: qty 2

## 2014-12-16 NOTE — Procedures (Signed)
Successful placement of right IJ approach port-a-cath with tip at the superior caval atrial junction. The catheter is ready for immediate use. No immediate post procedural complications. 

## 2014-12-16 NOTE — Progress Notes (Signed)
Rehab admissions - I am following pt's case. Per neuro," Primary CNS lymphoma, status post stereotactic brain biopsy: Patient is making incremental progress. My point of view she can be transferred to [facility] for further treatment whenever it is convenient. She is going to get a Port-A-Cath and a bone marrow biopsy."  I spoke with pt and her husband this am. Husband shared that pt is possibly going to be transferred to [place] on Saturday for further workup/treatment.  I will follow pt's case from a distance. I updated [facility] Rn reviewer as well.  Thanks.  Nanetta Batty, PT Rehabilitation Admissions Coordinator [phone]

## 2014-12-16 NOTE — Progress Notes (Signed)
I was called by pathology yesterday. 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 By her staging studies to date, we've not found any obvious systemic disease. However, we will have to get her to have a bone marrow biopsy done to make sure that there is no bone marrow disease that could have "seeded" the CNS.  Given that this is CNS lymphoma, she will need systemic chemotherapy. She will need high-dose methotrexate based chemotherapy.  Her husband says that she is being considered for rehabilitation. I would think that given her diagnosis, that a kind of inpatient rehabilitation will have to be on "hold".  She still is having weakness over on the right arm. There is still some dysarthria and apraxia.  She seems to be eating a little bit better. She is on a special diet.  There is no fever. She's had no seizures. She is on Keppra.  She has not had blood work for about one week. I will have to check some labs on her.  She will need to have a Port-A-Cath placed.  I would like to get started with treatment next week. If she can be transferred over to [facility], that would be optimal. If she can go onto the hospitalist service, I'll be more than happy to consult and then once she is stabilized a little bit more, then we can move ahead with treatment.  Her physical exam is pretty much unchanged. Her vital signs are stable.  Again, she has CNS lymphoma. We just have to make sure that there is no systemic disease. I cannot do a PET scan on her with her as an inpatient. However, a bone marrow biopsy can help [place] out.  As always, we had an excellent prayer session.  Pete E.  Romans 12:6

## 2014-12-16 NOTE — Consult Note (Signed)
Chief Complaint: AMS New dx CNS Lymphoma  Referring Physician(s): Dr Marin Olp  History of Present Illness: Tanya King is a 64 y.o. female   Pt suffering altered mental status for few weeks Speech difficulty and Rt sided weakness Work up reveals CNS lymphoma Dr Marin Olp has requested [place]- a cath insertion for chemotherapy  And Bone marrow biopsy to determine if any involvement there I have seen and examined pt  BM bx must be performed before noon for cytology  Pt has eaten some breakfast Npo at 800 am Will move forward with [facility] today Plan for BM bx 2/29   Past Medical History  Diagnosis Date  . Asthma   . Hypercholesteremia   . GERD (gastroesophageal reflux disease)   . Sleep apnea     AHI 116/hr now on BiPAP at 25/75mHg  . Edema   . HTN (hypertension)   . Allergic rhinitis   . Vitamin B12 deficiency   . RLS (restless legs syndrome)   . Vitamin deficiency   . Obesity   . Aortic stenosis   . Edema     chronic LE edema  . Heart murmur     (mild AV stenosis with no regurg, mild MV sclerosis with mild MR, mod LVH 2012)  . Carotid artery occlusion     <30% bilateral ICA stenosis, no flow in left vertebral artery  . COPD (chronic obstructive pulmonary disease)     per spouse not diagnosed    Past Surgical History  Procedure Laterality Date  . Tubal ligation    . Craniotomy N/A 11/29/2014    Procedure: LEFT BRAIN BIOPSY w/ Curve;  Surgeon: JNewman Pies MD;  [facility];  Service: Neurosurgery;  Laterality: N/A;  brain biopsy with brain lab    Allergies: Lisinopril and Shellfish allergy  Medications: Prior to Admission medications   Medication Sig Start Date End Date Taking? Authorizing Provider  albuterol (PROVENTIL HFA;VENTOLIN HFA) 108 (90 BASE) MCG/ACT inhaler Inhale 2 puffs into the lungs every 6 (six) hours as needed. wheezing   Yes Historical Provider, MD  Cholecalciferol (VITAMIN D) 2000 UNITS tablet Take 2,000 Units by mouth  daily.   Yes Historical Provider, MD  CRESTOR 10 MG tablet Take 1 tablet by mouth every evening. 11/17/14  Yes Historical Provider, MD  fluticasone-salmeterol (ADVAIR HFA) 115-21 MCG/ACT inhaler Inhale 2 puffs into the lungs 2 (two) times daily.   Yes Historical Provider, MD  losartan-hydrochlorothiazide (HYZAAR) 100-12.5 MG per tablet Take 1 tablet by mouth daily. 11/17/14  Yes Historical Provider, MD  dexamethasone (DECADRON) 4 MG tablet Take 1 tablet (4 mg total) by mouth 3 (three) times daily. 12/15/14   JNewman Pies MD  levETIRAcetam (KEPPRA) 500 MG tablet Take 1 tablet (500 mg total) by mouth 2 (two) times daily. 12/15/14   JNewman Pies MD     Family History  Problem Relation Age of Onset  . Parkinson's disease Father   . Lung cancer Brother   . Diabetes Brother   . Heart attack Brother   . Diabetes Mother   . Hyperlipidemia Mother     History   Social History  . Marital Status: Married    Spouse Name: N/A  . Number of Children: N/A  . Years of Education: N/A   Social History Main Topics  . Smoking status: Current Every Day Smoker -- 1.00 packs/day for 42 years    Types: Cigarettes  . Smokeless tobacco: Not on file  . Alcohol Use: No  .  Drug Use: No  . Sexual Activity: Not on file   Other Topics Concern  . None   Social History Narrative    Review of Systems: A 12 point ROS discussed and pertinent positives are indicated in the HPI above.  All other systems are negative.  Review of Systems  Constitutional: Positive for activity change, appetite change and fatigue. Negative for fever.  HENT: Positive for trouble swallowing and voice change.   Eyes: Negative for visual disturbance.  Respiratory: Negative for shortness of breath.   Gastrointestinal: Negative for abdominal pain.  Genitourinary: Negative for difficulty urinating.  Musculoskeletal: Positive for gait problem.  Neurological: Positive for speech difficulty, weakness and headaches. Negative for  dizziness, seizures, facial asymmetry and numbness.  Psychiatric/Behavioral: Positive for confusion.    Vital Signs: BP 148/61 mmHg  Pulse 50  Temp(Src) 97.3 F (36.3 C) (Oral)  Resp 18  Ht 5' 6" (1.676 m)  Wt 120.901 kg (266 lb 8.6 oz)  BMI 43.04 kg/m2  SpO2 94%  Physical Exam  Constitutional: She appears well-nourished.  Cardiovascular: Normal rate and regular rhythm.   No murmur heard. Pulmonary/Chest: Effort normal. She has no wheezes.  Abdominal: Soft. Bowel sounds are normal. There is no tenderness.  Musculoskeletal:  Rt side slow/weaker  Skin: Skin is warm and dry.  Psychiatric:  Pt difficult to understand ---speech changes Moves Left side Confusion  Consented with husband at bedside  Nursing note and vitals reviewed.   Mallampati Score:  MD Evaluation Airway: WNL Heart: WNL Abdomen: WNL Chest/ Lungs: WNL ASA  Classification: 3 Mallampati/Airway Score: Two  Imaging: Dg Chest 2 View  12/07/2014   CLINICAL DATA:  Altered mental status.  Altered speech.  EXAM: CHEST  2 VIEW  COMPARISON:  CT chest 09/14/2014.  PA and lateral chest 09/09/2014.  FINDINGS: The chest is hyperexpanded with peribronchial thickening. No consolidative process, pneumothorax or effusion. Heart size is normal.  IMPRESSION: COPD without acute disease.   Electronically Signed   By: Inge Rise M.D.   On: 12/07/2014 22:28   Ct Head Wo Contrast  12/13/2014   CLINICAL DATA:  64 year old female status post brain tumor biopsy yesterday. Initial encounter.  EXAM: CT HEAD WITHOUT CONTRAST  TECHNIQUE: Contiguous axial images were obtained from the base of the skull through the vertex without intravenous contrast.  COMPARISON:  Preoperative brain MRI 11/27/2014, and earlier.  FINDINGS: Visualized paranasal sinuses and mastoids are clear. Left frontal burr hole with overlying postoperative scalp soft tissue changes and skin staples. Elsewhere Stable visualized osseous structures. Negative orbits soft  tissues.  Calcified atherosclerosis at the skull base. A small volume of hemorrhage is noted within the biopsy tract which terminates at the left deep gray matter. There a gas and blood containing 15-18 mm biopsy cavity is present. Decreased left deep gray matter parenchymal hyperdensity and mass effect since 12/07/2014. Mildly decreased hypodensity in the vasogenic edema pattern. Rightward midline shift has mildly decreased to 4 mm. No ventriculomegaly or intraventricular hemorrhage. No acute cortically based infarct.  IMPRESSION: 1. Sequelae of left frontal approach brain biopsy into the left deep gray matter nuclei, with small volume intra-axial hemorrhage. 2. Regressed left basal ganglia hyperdensity and mass effect since 12/07/2014.   Electronically Signed   By: Genevie Ann M.D.   On: 12/13/2014 07:58   Ct Head Wo Contrast  12/07/2014   CLINICAL DATA:  Progressive weakness and slurred speech.  EXAM: CT HEAD WITHOUT CONTRAST  TECHNIQUE: Contiguous axial images were obtained from the base  of the skull through the vertex without intravenous contrast.  COMPARISON:  None.  FINDINGS: A single mass lesion versus two contiguous lesions is identified in the left cerebral hemisphere centered in the basal ganglia. Assuming one lesion, it measures 2.8 x 4.3 cm. There is surrounding vasogenic edema. Mass effect is present on the anterior horn of the left lateral ventricle and mild left-to-right shift of 0.5 cm. No other mass is identified. There is no hemorrhage, acute infarction or abnormal extra-axial fluid collection. The calvarium is intact.  IMPRESSION: The study is positive for a single large mass versus two contiguous masses centered in the left basal ganglia. MRI of the brain with and without contrast is recommended for further evaluation.   Electronically Signed   By: Inge Rise M.D.   On: 12/07/2014 21:43   Ct Chest W Contrast  12/08/2014   CLINICAL DATA:  Newly diagnosed brain mass, shortness of breath,  weakness, history of asthma, GERD, hypertension, COPD, smoker  EXAM: CT CHEST, ABDOMEN, AND PELVIS WITH CONTRAST  TECHNIQUE: Multidetector CT imaging of the chest, abdomen and pelvis was performed following the standard protocol during bolus administration of intravenous contrast. Sagittal and coronal MPR images reconstructed from axial data set.  CONTRAST:  172m OMNIPAQUE IOHEXOL 300 MG/ML SOLN IV. Dilute oral contrast.  COMPARISON:  PET-CT 09/21/2014  FINDINGS: CT CHEST FINDINGS  Scattered atherosclerotic calcifications aorta and coronary arteries.  No thoracic adenopathy.  Normal size precarinal lymph node 8 mm diameter image 19.  Dependent atelectasis in both lower lobes.  12 x 10 mm lingular nodule image 32, previously 13 x 11 mm, demonstrated no FDG avidity.  Tiny RIGHT middle lobe nodule 4 mm diameter image 32 unchanged.  Slight chronic accentuation of interstitial markings in the RIGHT upper lobe.  Remaining lungs clear without infiltrate, pleural effusion, pneumothorax or additional mass/ nodule.  Stable small sclerotic focus lateral RIGHT seventh rib in image 38.  No additional osseous findings.  CT ABDOMEN AND PELVIS FINDINGS  16 x 15 mm low-attenuation focus RIGHT lobe liver image 58 unchanged question small cyst.  Liver, gallbladder, spleen, pancreas, kidneys, and adrenal glands otherwise unremarkable.  Scattered atherosclerotic calcification without aneurysm.  Normal appendix.  Stomach and bowel loops grossly unremarkable for technique.  Unremarkable bladder, ureters, uterus and adnexa.  BILATERAL inguinal and tiny umbilical hernias containing fat.  No mass, adenopathy, free fluid, free air, or acute osseous findings.  IMPRESSION: Stable 12 x 10 mm lingular and 4 mm RIGHT middle lobe nodules.  Bibasilar atelectasis.  Question small hepatic cyst.  BILATERAL inguinal and tiny umbilical hernias.  No other definite intrathoracic, intra-abdominal or intrapelvic abnormalities.   Electronically Signed   By:  MLavonia DanaM.D.   On: 12/08/2014 21:58   Mr BJeri CosWLKContrast  12/15/2014   CLINICAL DATA:  Continued surveillance intracranial mass lesions. Preoperative exam. RIGHT hemiparesis. Mental status changes.  EXAM: MRI HEAD WITHOUT AND WITH CONTRAST  TECHNIQUE: Multiplanar, multiecho pulse sequences of the brain and surrounding structures were obtained without and with intravenous contrast.  CONTRAST:  MultiHance 20 mL.  COMPARISON:  MR brain 12/08/2014.  FINDINGS: The patient was unable to remain motionless for the exam. Small or subtle lesions could be overlooked. Redemonstrated are multiple enhancing lesions throughout both cerebral hemispheres, LEFT greater than RIGHT, particularly affecting the LEFT basal ganglia. Some of the lesions demonstrate restricted diffusion, are T2 hypointense, and are periventricular in location. There is a moderate amount of associated vasogenic edema with slight LEFT-to-RIGHT  midline shift of 3 mm. No new lesions have developed since priors. No dominant meningeal enhancement. Upper cervical region unremarkable. Extracranial soft tissues within normal limits.  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 No new lesions have developed since prior MR. Slight improvement LEFT-to-RIGHT shift following steroid administration.   Electronically Signed   By: Rolla Flatten M.D.   On: 11/23/2014 14:47   Mr Jeri Cos VQ Contrast  12/08/2014   CLINICAL DATA:  Brain mass  EXAM: MRI HEAD WITHOUT AND WITH CONTRAST  TECHNIQUE: Multiplanar, multiecho pulse sequences of the brain and surrounding structures were obtained without and with intravenous contrast.  CONTRAST:  73m MULTIHANCE GADOBENATE DIMEGLUMINE 529 MG/ML IV SOLN  COMPARISON:  CT head 12/07/2014  FINDINGS: Image quality degraded by moderate motion. The patient did not hold still which became progressive during the exam.  Multiple masses are present in the brain. The largest mass is centered in the left basal  ganglia and has a moderate amount of surrounding vasogenic edema. This is the lesion identified on CT. This mass shows relatively solid enhancement. The enhancing component measures approximately 23 x 44 mm.  10 mm enhancing nodule in the right mid temporal lobe.  11 mm enhancing mass in the right medial temporal lobe.  5 x 10 mm enhancing lesion in the right optic radiation  5 x 9 mm enhancing lesion in the right medial parietal lobe  13 mm enhancing cortical lesion in the left parietal cortex.  5 mm enhancing lesion right parietal cortex over the convexity.  The enhancing lesion show restricted diffusion. The large left basal ganglia lesion is iso to slightly hyper dense to cortex on CT. No necrosis identified.  Negative for intracranial hemorrhage. Ventricle size is normal. Mild midline shift to the right of approximately 5 mm.  IMPRESSION: Multiple enhancing mass lesions in the brain. The largest lesion is in the left basal ganglia. There is vasogenic edema around the lesions especially in the left basal ganglia. No significant central necrosis is seen in the lesions. 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 However the typical periventricular location of lesions is not a prominent feature in this case.  Differential diagnosis would include metastatic disease and multifocal GBM. Note is made of a left lung nodule which was not hypermetabolic on recent PET-CT. Infection not considered likely. Biopsy will be necessary for diagnosis.   Electronically Signed   By: CFranchot GalloM.D.   On: 12/08/2014 07:48   Ct Abdomen Pelvis W Contrast  12/08/2014   CLINICAL DATA:  Newly diagnosed brain mass, shortness of breath, weakness, history of asthma, GERD, hypertension, COPD, smoker  EXAM: CT CHEST, ABDOMEN, AND PELVIS WITH CONTRAST  TECHNIQUE: Multidetector CT imaging of the chest,  abdomen and pelvis was performed following the standard protocol during bolus administration of intravenous contrast. Sagittal and coronal MPR images reconstructed from axial data set.  CONTRAST:  1034mOMNIPAQUE IOHEXOL 300 MG/ML SOLN IV. Dilute oral contrast.  COMPARISON:  PET-CT 09/21/2014  FINDINGS: CT CHEST FINDINGS  Scattered atherosclerotic calcifications aorta and coronary arteries.  No thoracic adenopathy.  Normal size precarinal lymph node 8 mm diameter image 19.  Dependent atelectasis in both lower lobes.  12 x 10 mm lingular nodule image 32, previously 13 x 11 mm, demonstrated no FDG avidity.  Tiny RIGHT middle lobe nodule 4 mm diameter  image 32 unchanged.  Slight chronic accentuation of interstitial markings in the RIGHT upper lobe.  Remaining lungs clear without infiltrate, pleural effusion, pneumothorax or additional mass/ nodule.  Stable small sclerotic focus lateral RIGHT seventh rib in image 38.  No additional osseous findings.  CT ABDOMEN AND PELVIS FINDINGS  16 x 15 mm low-attenuation focus RIGHT lobe liver image 58 unchanged question small cyst.  Liver, gallbladder, spleen, pancreas, kidneys, and adrenal glands otherwise unremarkable.  Scattered atherosclerotic calcification without aneurysm.  Normal appendix.  Stomach and bowel loops grossly unremarkable for technique.  Unremarkable bladder, ureters, uterus and adnexa.  BILATERAL inguinal and tiny umbilical hernias containing fat.  No mass, adenopathy, free fluid, free air, or acute osseous findings.  IMPRESSION: Stable 12 x 10 mm lingular and 4 mm RIGHT middle lobe nodules.  Bibasilar atelectasis.  Question small hepatic cyst.  BILATERAL inguinal and tiny umbilical hernias.  No other definite intrathoracic, intra-abdominal or intrapelvic abnormalities.   Electronically Signed   By: Lavonia Dana M.D.   On: 12/08/2014 21:58    Labs:  CBC:  Recent Labs  12/07/14 1959 12/08/14 0414  WBC 11.9* 9.6  HGB 16.0* 15.9*  HCT 46.6* 46.8*  PLT  157 163    COAGS: No results for input(s): INR, APTT in the last 8760 hours.  BMP:  Recent Labs  12/07/14 1959 12/08/14 0414  NA 139 138  K 3.8 4.3  CL 102 104  CO2 28 26  GLUCOSE 93 156*  BUN 11 11  CALCIUM 9.4 9.2  CREATININE 0.87 0.73  GFRNONAA 70* >90  GFRAA 82* >90    LIVER FUNCTION TESTS:  Recent Labs  12/07/14 1959 12/08/14 0414  BILITOT 0.7 0.6  AST 24 19  ALT 20 19  ALKPHOS 56 51  PROT 7.3 6.9  ALBUMIN 3.8 3.6    TUMOR MARKERS: No results for input(s): AFPTM, CEA, CA199, CHROMGRNA in the last 8760 hours.  Assessment and Plan:  New dx CNS lymphoma Scheduled for Port a cath and BM bx PAC for today BM Bx 2/29 Husband aware of procedure benefits and risks including but limited to: Infection; bleeding; vessel damage Bone damage; damage to surrounding structures Agreeable to proceed Consent signed andin chart Ancef on call Labs pending  Thank you for this interesting consult.  I greatly enjoyed meeting Tanya King and look forward to participating in their care.  Signed: , A 12/16/2014, 8:48 AM   I spent a total of 40 Minutes  in face to face in clinical consultation, greater than 50% of which was counseling/coordinating care for PAC and BM bx

## 2014-12-16 NOTE — Progress Notes (Signed)
Physical Therapy Treatment Patient Details Name: Tanya King MRN: [id number] DOB: [birth year] Today's Date: 12/16/2014    History of Present Illness This 64 y.o. femlae admitted 12/07/2014 with AMS, Rt hemiparesis, dyarthria and apraxia.  MRI showed multiple intracranial lesions.  Metastatic worku p done thus far has been negative.  She underwent stereotatic brain biopsy 12/01/2014 with pathology results pending.  PMH includes: aortic stenosis; asthma; HTN    PT Comments    Pt very motivated, although affect is flat. SaO2 was 88% on arrival with pt on 1.5 L O2 (HOB 38 degrees). RN made aware and in to assess.  O2 slowly increased by RN with pt requiring 3L to achieve 90-91% SaO2. Once sitting EOB SaO2 93% and maintained >90% on 3L with activity.   Follow Up Recommendations  Other (comment) (TBA--starting chemotherapy at [facility])     Equipment Recommendations  None recommended by PT    Recommendations for Other Services OT consult     Precautions / Restrictions Precautions Precautions: Fall Restrictions Weight Bearing Restrictions: No    Mobility  Bed Mobility Overal bed mobility: Needs Assistance;+2 for physical assistance Bed Mobility: Supine to Sit     Supine to sit: Max assist;HOB elevated     General bed mobility comments: Pt with HOB elevated due to only one person to assist her; pt using Lt extremities effectively  Transfers Overall transfer level: Needs assistance Equipment used:  (2 person with bed pad under pelvis) Transfers: Sit to/from Stand;Stand Pivot Transfers Sit to Stand: Max assist;+2 physical assistance Stand pivot transfers: Max assist;+2 physical assistance       General transfer comment: to pt's rt; pt taking steps toward chair with RLE, required assis to advance LLE  Ambulation/Gait                 Stairs            Wheelchair Mobility    Modified Rankin (Stroke Patients Only)       Balance Overall balance assessment: Needs  assistance Sitting-balance support: Single extremity supported;Feet supported Sitting balance-Leahy Scale: Poor Sitting balance - Comments: leans to Rt, required mod assist at times; progressed to periods of minguard Postural control: Right lateral lean Standing balance support: Single extremity supported;During functional activity Standing balance-Leahy Scale: Poor Standing balance comment: 2 person assist with bed pad under pelvis                    Cognition Arousal/Alertness: Awake/alert Behavior During Therapy: Flat affect Overall Cognitive Status: Difficult to assess                 General Comments: Pt does follow one step functional commands in context of activity     Exercises General Exercises - Lower Extremity Ankle Circles/Pumps: AROM;AAROM;Both Heel Slides: AROM;Strengthening;AAROM;Both (assisted flexion with resisted extension) Other Exercises Other Exercises: PROM RUE during rest periods    General Comments        Pertinent Vitals/Pain See above clinical impression/comments  Pain Assessment: Faces Faces Pain Scale: No hurt    Home Living                      Prior Function            PT Goals (current goals can now be found in the care plan section) Progress towards PT goals: Progressing toward goals    Frequency  Min 3X/week    PT Plan Other (comment) (TBA-plans to initiate chemotherapy  at [facility])    Co-evaluation             End of Session Equipment Utilized During Treatment: Gait belt;Oxygen Activity Tolerance: Patient tolerated treatment well Patient left: in chair;with call bell/phone within reach;with chair alarm set;with family/visitor present     Time: 7517-0017 PT Time Calculation (min) (ACUTE ONLY): 36 min  Charges:  $Therapeutic Exercise: 8-22 mins $Therapeutic Activity: 8-22 mins                    G Codes:      Mattia Liford 2015-01-14, 11:46 AM Pager [phone]

## 2014-12-16 NOTE — Sedation Documentation (Signed)
No sedation was given for this proceedure

## 2014-12-16 NOTE — Progress Notes (Signed)
Patient ID: Tanya King, female   DOB: 03/07/1951, 64 y.o.   MRN: [id number] Subjective:  The patient is alert and pleasant. I spoke with her husband. She is in no apparent distress.  Objective: Vital signs in last 24 hours: Temp:  [97.3 F (36.3 C)-98.7 F (37.1 C)] 97.5 F (36.4 C) (02/26 0945) Pulse Rate:  [50-78] 61 (02/26 0945) Resp:  [18] 18 (02/26 0945) BP: (93-155)/(51-61) 155/51 mmHg (02/26 0945) SpO2:  [91 %-95 %] 91 % (02/26 0945)  Intake/Output from previous day:   Intake/Output this shift:    Physical exam the patient is alert and pleasant. She remains dysarthric but a bit better today. She is quite weak in her right upper extremity. She moves her right lower extremity. Her incision is healing well.  Lab Results: No results for input(s): WBC, HGB, HCT, PLT in the last 72 hours. BMET No results for input(s): NA, K, CL, CO2, GLUCOSE, BUN, CREATININE, CALCIUM in the last 72 hours.  Studies/Results: No results found.  Assessment/Plan: Primary CNS lymphoma, status post stereotactic brain biopsy: Patient is making incremental progress. My point of view she can be transferred to [facility] for further treatment whenever it is convenient. She is going to get a Port-A-Cath and a bone marrow biopsy.  LOS: 4 days     Haydyn Liddell D 12/16/2014, 9:58 AM

## 2014-12-17 LAB — COMPREHENSIVE METABOLIC PANEL
ALK PHOS: 49 U/L (ref 39–117)
ALT: 16 U/L (ref 0–35)
AST: 14 U/L (ref 0–37)
Albumin: 2.7 g/dL — ABNORMAL LOW (ref 3.5–5.2)
Anion gap: 10 (ref 5–15)
BILIRUBIN TOTAL: 0.8 mg/dL (ref 0.3–1.2)
BUN: 23 mg/dL (ref 6–23)
CHLORIDE: 101 mmol/L (ref 96–112)
CO2: 25 mmol/L (ref 19–32)
CREATININE: 0.76 mg/dL (ref 0.50–1.10)
Calcium: 8.6 mg/dL (ref 8.4–10.5)
GFR calc Af Amer: >90 mL/min (ref >90)
GFR, EST NON AFRICAN AMERICAN: 89 mL/min — AB (ref >90)
GLUCOSE: 84 mg/dL (ref 70–99)
Potassium: 5.4 mmol/L — ABNORMAL HIGH (ref 3.5–5.1)
Sodium: 136 mmol/L (ref 135–145)
Total Protein: 5.4 g/dL — ABNORMAL LOW (ref 6.0–8.3)

## 2014-12-17 LAB — CBC
HEMATOCRIT: 49.6 % — AB (ref 36.0–46.0)
Hemoglobin: 16.7 g/dL — ABNORMAL HIGH (ref 12.0–15.0)
MCH: 30.6 pg (ref 26.0–34.0)
MCHC: 33.7 g/dL (ref 30.0–36.0)
MCV: 91 fL (ref 78.0–100.0)
Platelets: 112 10*3/uL — ABNORMAL LOW (ref 150–400)
RBC: 5.45 MIL/uL — AB (ref 3.87–5.11)
RDW: 14.1 % (ref 11.5–15.5)
WBC: 11.9 10*3/uL — ABNORMAL HIGH (ref 4.0–10.5)

## 2014-12-17 MED ORDER — SODIUM CHLORIDE 0.9 % IJ SOLN
10.0000 mL | INTRAMUSCULAR | Status: DC | PRN
  Administered 2014-12-18: 10 mL
  Filled 2014-12-17: qty 40

## 2014-12-17 MED ORDER — BISACODYL 10 MG RE SUPP
10.0000 mg | Freq: Once | RECTAL | Status: AC
Start: 2014-12-17 — End: 2014-12-17
  Administered 2014-12-17: 10 mg via RECTAL
  Filled 2014-12-17: qty 1

## 2014-12-17 NOTE — Progress Notes (Signed)
Patient ID: Tanya King, female   DOB: 11-15-1950, 64 y.o.   MRN: [id number]    CNS Lymphoma IR placed [facility] 2/26  Now scheduled for BM BX 12/19/14 Orders in for same

## 2014-12-17 NOTE — Progress Notes (Signed)
No issues overnight. Pt reports feeling well. Speech is more clear today according to her husband.  EXAM:  BP 133/45 mmHg  Pulse 52  Temp(Src) 97.5 F (36.4 C) (Oral)  Resp 20  Ht 5' 6"  (1.676 m)  Wt 120.901 kg (266 lb 8.6 oz)  BMI 43.04 kg/m2  SpO2 93%  Awake, alert Dysarthric Stable RUE weakness Wound c/d/i  IMPRESSION:  64 y.o. female s/p brain biopsy with PCNSL - Needs bone marrow biopsy  PLAN: - Will plan on transfer to [facility] when possible

## 2014-12-18 LAB — URINALYSIS, DIPSTICK ONLY
BILIRUBIN URINE: NEGATIVE
GLUCOSE, UA: NEGATIVE mg/dL
Hgb urine dipstick: NEGATIVE
Ketones, ur: NEGATIVE mg/dL
LEUKOCYTES UA: NEGATIVE
Nitrite: NEGATIVE
PROTEIN: NEGATIVE mg/dL
Specific Gravity, Urine: 1.022 (ref 1.005–1.030)
Urobilinogen, UA: 1 mg/dL (ref 0.0–1.0)
pH: 6.5 (ref 5.0–8.0)

## 2014-12-18 MED ORDER — FLUCONAZOLE IN SODIUM CHLORIDE 200-0.9 MG/100ML-% IV SOLN
200.0000 mg | INTRAVENOUS | Status: DC
  Administered 2014-12-18: 200 mg via INTRAVENOUS
  Filled 2014-12-18 (×2): qty 100

## 2014-12-18 MED ORDER — FAMCICLOVIR 500 MG PO TABS
500.0000 mg | ORAL_TABLET | Freq: Every day | ORAL | Status: DC
  Administered 2014-12-18 – 2015-01-02 (×16): 500 mg via ORAL
  Filled 2014-12-18 (×17): qty 1

## 2014-12-18 MED ORDER — SODIUM BICARBONATE 8.4 % IV SOLN
INTRAVENOUS | Status: AC
  Administered 2014-12-18 – 2014-12-20 (×3): via INTRAVENOUS
  Filled 2014-12-18 (×9): qty 150

## 2014-12-18 NOTE — Progress Notes (Signed)
Tanya King actually looks a little bit better. She is able to talk a little bit more. They're still not much movement with the right arm. Right leg might be a little bit stronger.  She got a Port-A-Cath placed on Friday. I really thank radiology for doing this.  She sees be eating a little bit better.  She's had no fever. There's been no seizures.  Her labs yesterday looked okay area in clinic as down just a little bit. This might be from medications. Her potassium is up a little bit. She was on IV fluid with potassium. This is been stopped.  Her physical exam shows stable vital signs. Temperature 90.8. Pulse 51. Blood pressure 131/56. Head and neck exam shows the biopsy is site in the left frontoparietal region. This is healing. She has some possible thrush in the oral cavity. Lungs are clear. Cardiac exam regular rate and rhythm. Abdomen is soft. She is mildly obese. Bowel sounds are active. Extremities shows some trace edema in her legs. She has some slight better strength in the right leg.  From my point of view, she be moved over to [place] long hospital any time. Her Port-A-Cath is in so we can start her on treatment.  The bone marrow can be done at any time.  As always, we had an excellent prayer session. She and her husband's faith continued to be very strong.  Pete E.  Ephesians 6:10

## 2014-12-18 NOTE — Progress Notes (Signed)
Subjective: Patient reports dysarthric.  Objective: Vital signs in last 24 hours: Temp:  [97.5 F (36.4 C)-98.3 F (36.8 C)] 98 F (36.7 C) (02/28 0093) Pulse Rate:  [51-64] 51 (02/28 0632) Resp:  [16-20] 18 (02/28 0632) BP: (128-161)/(44-57) 131/56 mmHg (02/28 0632) SpO2:  [92 %-96 %] 94 % (02/28 0809)  Intake/Output from previous day: 02/27 0701 - 02/28 0700 In: 120 [P.O.:120] Out: -  Intake/Output this shift:    Physical Exam: Dysarthia and RUE weakness persist and are stable.  Lab Results:  Recent Labs  12/16/14 0854 12/17/14 0550  WBC 13.0* 11.9*  HGB 18.1* 16.7*  HCT 52.0* 49.6*  PLT 112* 112*   BMET  Recent Labs  12/16/14 0854 12/17/14 0550  NA 137 136  K 4.5 5.4*  CL 103 101  CO2 25 25  GLUCOSE 166* 84  BUN 23 23  CREATININE 0.92 0.76  CALCIUM 8.4 8.6    Studies/Results: Ir Fluoro Guide Cv Line Right  12/16/2014   INDICATION: History of lymphoma. In need of intravenous access for chemotherapy administration  EXAM: IMPLANTED PORT A CATH PLACEMENT WITH ULTRASOUND AND FLUOROSCOPIC GUIDANCE  COMPARISON:  Chest CT - 12/08/2014  MEDICATIONS: Ancef 2 g IV; The antibiotic was administered within an appropriate time interval prior to skin puncture.  ANESTHESIA/SEDATION: None  CONTRAST:  None  FLUOROSCOPY TIME:  48 seconds (23 mGy).  COMPLICATIONS: None immediate  PROCEDURE: The procedure, risks, benefits, and alternatives were explained to the patient. Questions regarding the procedure were encouraged and answered. The patient understands and consents to the procedure.  The right neck and chest were prepped with chlorhexidine in a sterile fashion, and a sterile drape was applied covering the operative field. Maximum barrier sterile technique with sterile gowns and gloves were used for the procedure. A timeout was performed prior to the initiation of the procedure. Local anesthesia was provided with 1% lidocaine with epinephrine.  After creating a small venotomy  incision, a micropuncture kit was utilized to access the internal jugular vein under direct, real-time ultrasound guidance. Ultrasound image documentation was performed. The microwire was kinked to measure appropriate catheter length.  A subcutaneous port pocket was then created along the upper chest wall utilizing a combination of sharp and blunt dissection. The pocket was irrigated with sterile saline. A single lumen ISP power injectable port was chosen for placement. The 8 Fr catheter was tunneled from the port pocket site to the venotomy incision. The port was placed in the pocket. The external catheter was trimmed to appropriate length. At the venotomy, an 8 Fr peel-away sheath was placed over a guidewire under fluoroscopic guidance. The catheter was then placed through the sheath and the sheath was removed. Final catheter positioning was confirmed and documented with a fluoroscopic spot radiograph. The port was accessed with a Huber needle, aspirated and flushed with heparinized saline.  The venotomy site was closed with an interrupted 4-0 Vicryl suture. The port pocket incision was closed with interrupted 2-0 Vicryl suture and the skin was opposed with a running subcuticular 4-0 Vicryl suture. Dermabond and Steri-strips were applied to both incisions. Dressings were placed. The patient tolerated the procedure well without immediate post procedural complication.  FINDINGS: After catheter placement, the tip lies within the superior cavoatrial junction. The catheter aspirates and flushes normally and is ready for immediate use.  IMPRESSION: Successful placement of a right internal jugular approach power injectable Port-A-Cath. The catheter is ready for immediate use.   Electronically Signed   By: Jenny Reichmann  Watts M.D.   On: 12/16/2014 18:50   Ir US Guide Vasc Access Right  12/16/2014   INDICATION: History of lymphoma. In need of intravenous access for chemotherapy administration  EXAM: IMPLANTED PORT A CATH  PLACEMENT WITH ULTRASOUND AND FLUOROSCOPIC GUIDANCE  COMPARISON:  Chest CT - 12/08/2014  MEDICATIONS: Ancef 2 g IV; The antibiotic was administered within an appropriate time interval prior to skin puncture.  ANESTHESIA/SEDATION: None  CONTRAST:  None  FLUOROSCOPY TIME:  48 seconds (23 mGy).  COMPLICATIONS: None immediate  PROCEDURE: The procedure, risks, benefits, and alternatives were explained to the patient. Questions regarding the procedure were encouraged and answered. The patient understands and consents to the procedure.  The right neck and chest were prepped with chlorhexidine in a sterile fashion, and a sterile drape was applied covering the operative field. Maximum barrier sterile technique with sterile gowns and gloves were used for the procedure. A timeout was performed prior to the initiation of the procedure. Local anesthesia was provided with 1% lidocaine with epinephrine.  After creating a small venotomy incision, a micropuncture kit was utilized to access the internal jugular vein under direct, real-time ultrasound guidance. Ultrasound image documentation was performed. The microwire was kinked to measure appropriate catheter length.  A subcutaneous port pocket was then created along the upper chest wall utilizing a combination of sharp and blunt dissection. The pocket was irrigated with sterile saline. A single lumen ISP power injectable port was chosen for placement. The 8 Fr catheter was tunneled from the port pocket site to the venotomy incision. The port was placed in the pocket. The external catheter was trimmed to appropriate length. At the venotomy, an 8 Fr peel-away sheath was placed over a guidewire under fluoroscopic guidance. The catheter was then placed through the sheath and the sheath was removed. Final catheter positioning was confirmed and documented with a fluoroscopic spot radiograph. The port was accessed with a Huber needle, aspirated and flushed with heparinized saline.  The  venotomy site was closed with an interrupted 4-0 Vicryl suture. The port pocket incision was closed with interrupted 2-0 Vicryl suture and the skin was opposed with a running subcuticular 4-0 Vicryl suture. Dermabond and Steri-strips were applied to both incisions. Dressings were placed. The patient tolerated the procedure well without immediate post procedural complication.  FINDINGS: After catheter placement, the tip lies within the superior cavoatrial junction. The catheter aspirates and flushes normally and is ready for immediate use.  IMPRESSION: Successful placement of a right internal jugular approach power injectable Port-A-Cath. The catheter is ready for immediate use.   Electronically Signed   By: Sandi Mariscal M.D.   On: 12/16/2014 18:50    Assessment/Plan: Transfer to [facility] for bone marrow and further treatment for PCNSL.  Transfer can occur today if can be arranged with [facility].    LOS: 6 days    Peggyann Shoals, MD 12/18/2014, 8:55 AM

## 2014-12-18 NOTE — Progress Notes (Signed)
MD mentioned in his note that patient can transfer to [place], but no order was put in place. MD's office called and Dr Kathyrn Sheriff called back and said he is waiting for oncology to call him with a bed for the patient.

## 2014-12-19 ENCOUNTER — Other Ambulatory Visit (HOSPITAL_COMMUNITY): Payer: 59

## 2014-12-19 ENCOUNTER — Ambulatory Visit (HOSPITAL_COMMUNITY)
Admission: EM | Admit: 2014-12-19 | Discharge: 2014-12-19 | Disposition: A | Discharge status: Home / Self Care | Payer: 59 | Source: Ambulatory Visit | Attending: Hematology & Oncology | Admitting: Hematology & Oncology

## 2014-12-19 LAB — URINALYSIS, DIPSTICK ONLY
Bilirubin Urine: NEGATIVE
Glucose, UA: NEGATIVE mg/dL
Hgb urine dipstick: NEGATIVE
KETONES UR: NEGATIVE mg/dL
Leukocytes, UA: NEGATIVE
Nitrite: NEGATIVE
PH: 7.5 (ref 5.0–8.0)
Protein, ur: NEGATIVE mg/dL
Specific Gravity, Urine: 1.022 (ref 1.005–1.030)
Urobilinogen, UA: 1 mg/dL (ref 0.0–1.0)

## 2014-12-19 LAB — COMPREHENSIVE METABOLIC PANEL
ALK PHOS: 50 U/L (ref 39–117)
ALT: 17 U/L (ref 0–35)
AST: 15 U/L (ref 0–37)
Albumin: 2.6 g/dL — ABNORMAL LOW (ref 3.5–5.2)
Anion gap: 7 (ref 5–15)
BUN: 23 mg/dL (ref 6–23)
CALCIUM: 8.6 mg/dL (ref 8.4–10.5)
CO2: 32 mmol/L (ref 19–32)
Chloride: 99 mmol/L (ref 96–112)
Creatinine, Ser: 0.83 mg/dL (ref 0.50–1.10)
GFR calc Af Amer: 86 mL/min — ABNORMAL LOW (ref >90)
GFR calc non Af Amer: 75 mL/min — ABNORMAL LOW (ref >90)
GLUCOSE: 122 mg/dL — AB (ref 70–99)
Potassium: 4.7 mmol/L (ref 3.5–5.1)
SODIUM: 138 mmol/L (ref 135–145)
Total Bilirubin: 0.7 mg/dL (ref 0.3–1.2)
Total Protein: 5.5 g/dL — ABNORMAL LOW (ref 6.0–8.3)

## 2014-12-19 LAB — CBC
HEMATOCRIT: 51.3 % — AB (ref 36.0–46.0)
Hemoglobin: 17.4 g/dL — ABNORMAL HIGH (ref 12.0–15.0)
MCH: 31.4 pg (ref 26.0–34.0)
MCHC: 33.9 g/dL (ref 30.0–36.0)
MCV: 92.6 fL (ref 78.0–100.0)
PLATELETS: 82 10*3/uL — AB (ref 150–400)
RBC: 5.54 MIL/uL — ABNORMAL HIGH (ref 3.87–5.11)
RDW: 14 % (ref 11.5–15.5)
WBC: 14.8 10*3/uL — ABNORMAL HIGH (ref 4.0–10.5)

## 2014-12-19 LAB — SURGICAL PCR SCREEN
MRSA, PCR: NEGATIVE
Staphylococcus aureus: NEGATIVE

## 2014-12-19 LAB — BONE MARROW EXAM

## 2014-12-19 MED ORDER — MIDAZOLAM HCL 2 MG/2ML IJ SOLN
INTRAMUSCULAR | Status: AC
  Filled 2014-12-19: qty 2

## 2014-12-19 MED ORDER — LIDOCAINE HCL 1 % IJ SOLN
INTRAMUSCULAR | Status: AC
  Filled 2014-12-19: qty 20

## 2014-12-19 MED ORDER — FLUCONAZOLE 100 MG PO TABS
100.0000 mg | ORAL_TABLET | Freq: Every day | ORAL | Status: DC
  Administered 2014-12-19 – 2015-01-02 (×15): 100 mg via ORAL
  Filled 2014-12-19 (×16): qty 1

## 2014-12-19 MED ORDER — SODIUM BICARBONATE/SODIUM CHLORIDE MOUTHWASH
Freq: Four times a day (QID) | OROMUCOSAL | Status: DC
  Administered 2014-12-20 – 2015-01-02 (×48): via OROMUCOSAL
  Filled 2014-12-19 (×3): qty 1000

## 2014-12-19 MED ORDER — FENTANYL CITRATE 0.05 MG/ML IJ SOLN
INTRAMUSCULAR | Status: AC | PRN
  Administered 2014-12-19: 50 ug via INTRAVENOUS

## 2014-12-19 MED ORDER — MIDAZOLAM HCL 2 MG/2ML IJ SOLN
INTRAMUSCULAR | Status: AC | PRN
  Administered 2014-12-19: 1 mg via INTRAVENOUS

## 2014-12-19 MED ORDER — DEXAMETHASONE 4 MG PO TABS
8.0000 mg | ORAL_TABLET | Freq: Three times a day (TID) | ORAL | Status: DC
  Administered 2014-12-19 – 2014-12-26 (×21): 8 mg via ORAL
  Filled 2014-12-19 (×24): qty 2

## 2014-12-19 MED ORDER — FENTANYL CITRATE 0.05 MG/ML IJ SOLN
INTRAMUSCULAR | Status: AC
  Filled 2014-12-19: qty 2

## 2014-12-19 NOTE — Sedation Documentation (Signed)
Patient denies pain and is resting comfortably.  

## 2014-12-19 NOTE — Progress Notes (Signed)
Report called to Nicole Cella, RN 3W receiving nurse at [facility]. Pt to transfer out via [place].

## 2014-12-19 NOTE — Sedation Documentation (Signed)
Pt placed prone, on 3L [place] tolerating well, denies any pain/discomfort. Vitals WDL.

## 2014-12-19 NOTE — Progress Notes (Signed)
Patient ID: Tanya King, female   DOB: [birth year], 64 y.o.   MRN: [id number] Subjective:  The patient is alert and pleasant. Her speech is a bit better this morning. She is in no apparent distress. I spoke with her husband.  Objective: Vital signs in last 24 hours: Temp:  [97.5 F (36.4 C)-98.1 F (36.7 C)] 97.5 F (36.4 C) (02/29 0705) Pulse Rate:  [52-58] 52 (02/29 0705) Resp:  [16-20] 16 (02/29 0705) BP: (118-149)/(49-61) 149/61 mmHg (02/29 0705) SpO2:  [94 %-97 %] 97 % (02/29 0705)  Intake/Output from previous day: 02/28 0701 - 02/29 0700 In: -  Out: 300 [Urine:300] Intake/Output this shift:    Physical exam the patient is alert and pleasant. She remains dysarthric. She is right hemiparetic. Her hand is a bit stronger today. Her biopsy incision is healing well.  Lab Results:  Recent Labs  12/17/14 0550 12/19/14 0500  WBC 11.9* 14.8*  HGB 16.7* 17.4*  HCT 49.6* 51.3*  PLT 112* 82*   BMET  Recent Labs  12/17/14 0550 12/19/14 0500  NA 136 138  K 5.4* 4.7  CL 101 99  CO2 25 32  GLUCOSE 84 122*  BUN 23 23  CREATININE 0.76 0.83  CALCIUM 8.6 8.6    Studies/Results: No results found.  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  LOS: 7 days     [name] D 12/19/2014, 7:52 AM

## 2014-12-19 NOTE — Progress Notes (Signed)
Rehab admissions - I am following pt's case and made note of pt's anticipated transfer to [facility] today. 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 [facility]  I will now sign off pt's case. Thank you.  Nanetta Batty, PT Rehabilitation Admissions Coordinator [phone]

## 2014-12-19 NOTE — Progress Notes (Signed)
Tanya King is making some progress. She sees be able to move her right arm a little bit better. Her speech might be getting a little bit better.  She can be moved over to 3 W. at [facility]. I need to have the primary team but the transfer order in.  She is scheduled for a bone marrow test today.  There is no fever. She is eating pured food. She is not having any aspiration problems.  Her labs so far look pretty good today. We are awaiting the platelet count.  Her urine pH is where I wanted to be. I appreciate pharmacy help me out with the IV fluids.  Her vital signs are stable. Blood pressure 149/61.Marland Kitchen Her lungs are clear. Her oral cavity is somewhat dry. Cardiac exam regular rate and rhythm. Abdomen is soft. Somewhat obese. Bowel sounds are present. Extremities shows some slightly better movement in the right arm.  Again, once she is transferred over to [facility], then we can start treatment on her.  Her husband is ready for her to start some therapy.  Frederich Cha 1:17

## 2014-12-19 NOTE — Procedures (Signed)
L iliac BM asp/core No comp

## 2014-12-20 DIAGNOSIS — C8589 Other specified types of non-Hodgkin lymphoma, extranodal and solid organ sites: Secondary | ICD-10-CM

## 2014-12-20 LAB — URINALYSIS, DIPSTICK ONLY
Bilirubin Urine: NEGATIVE
Bilirubin Urine: NEGATIVE
Glucose, UA: NEGATIVE mg/dL
Glucose, UA: NEGATIVE mg/dL
Hgb urine dipstick: NEGATIVE
Hgb urine dipstick: NEGATIVE
KETONES UR: NEGATIVE mg/dL
Ketones, ur: NEGATIVE mg/dL
Leukocytes, UA: NEGATIVE
Leukocytes, UA: NEGATIVE
Nitrite: NEGATIVE
Nitrite: NEGATIVE
Protein, ur: NEGATIVE mg/dL
Protein, ur: NEGATIVE mg/dL
SPECIFIC GRAVITY, URINE: 1.026 (ref 1.005–1.030)
Specific Gravity, Urine: 1.02 (ref 1.005–1.030)
Urobilinogen, UA: 1 mg/dL (ref 0.0–1.0)
Urobilinogen, UA: 1 mg/dL (ref 0.0–1.0)
pH: 7 (ref 5.0–8.0)
pH: 7.5 (ref 5.0–8.0)

## 2014-12-20 MED ORDER — SODIUM CHLORIDE 0.9 % IV SOLN
INTRAVENOUS | Status: DC
  Administered 2014-12-20: 10:00:00 via INTRAVENOUS

## 2014-12-20 MED ORDER — DIPHENHYDRAMINE HCL 50 MG/ML IJ SOLN
25.0000 mg | Freq: Once | INTRAMUSCULAR | Status: AC
  Administered 2014-12-20: 25 mg via INTRAVENOUS
  Filled 2014-12-20: qty 1

## 2014-12-20 MED ORDER — ENOXAPARIN SODIUM 40 MG/0.4ML ~~LOC~~ SOLN
40.0000 mg | Freq: Every day | SUBCUTANEOUS | Status: DC
  Administered 2014-12-20 – 2014-12-31 (×12): 40 mg via SUBCUTANEOUS
  Filled 2014-12-20 (×12): qty 0.4

## 2014-12-20 MED ORDER — HEPARIN SOD (PORK) LOCK FLUSH 100 UNIT/ML IV SOLN: 500.0000 [IU] | Freq: Once | INTRAVENOUS | Status: AC | PRN

## 2014-12-20 MED ORDER — SODIUM CHLORIDE 0.9 % IJ SOLN: 3.0000 mL | INTRAMUSCULAR | Status: DC | PRN

## 2014-12-20 MED ORDER — HEPARIN SOD (PORK) LOCK FLUSH 100 UNIT/ML IV SOLN: 250.0000 [IU] | Freq: Once | INTRAVENOUS | Status: AC | PRN

## 2014-12-20 MED ORDER — ACETAMINOPHEN 325 MG PO TABS
650.0000 mg | ORAL_TABLET | Freq: Once | ORAL | Status: AC
  Administered 2014-12-20: 650 mg via ORAL
  Filled 2014-12-20: qty 2

## 2014-12-20 MED ORDER — SODIUM CHLORIDE 0.9 % IV SOLN
500.0000 mg/m2 | Freq: Once | INTRAVENOUS | Status: AC
  Administered 2014-12-20: 1200 mg via INTRAVENOUS
  Filled 2014-12-20: qty 120

## 2014-12-20 MED ORDER — SODIUM CHLORIDE 4 MEQ/ML IV SOLN
INTRAVENOUS | Status: DC
  Administered 2014-12-20 – 2015-01-02 (×28): via INTRAVENOUS
  Filled 2014-12-20 (×65): qty 9.7

## 2014-12-20 MED ORDER — SODIUM CHLORIDE 0.9 % IJ SOLN: 10.0000 mL | INTRAMUSCULAR | Status: DC | PRN

## 2014-12-20 MED ORDER — ALTEPLASE 2 MG IJ SOLR
2.0000 mg | Freq: Once | INTRAMUSCULAR | Status: AC | PRN
  Filled 2014-12-20: qty 2

## 2014-12-20 NOTE — Progress Notes (Signed)
Patient tolerating Rituximab infusion well, no s/s of reaction noted. Rate increased to 100 mg/hr, will continue to monitor.

## 2014-12-20 NOTE — Progress Notes (Signed)
Patient tolerating Rituximab infusion well, no s/s of reaction noted. Rate increased to 150mg /hr. Will continue to monitor.

## 2014-12-20 NOTE — Progress Notes (Signed)
Patient tolerating rituximab infusion well, no s/s of reaction.  Rate increased to 200 mg/hr. Will continue to monitor.

## 2014-12-20 NOTE — Progress Notes (Signed)
Patient tolerating rituxan infusion well, no s/s of reaction. Rate increased to 350mg /hr. Will continue to monitor.

## 2014-12-20 NOTE — Progress Notes (Signed)
This balance is now back it was in the hospital. She had a bone marrow biopsy done yesterday.  We will go ahead and start her on chemotherapy today.  She sees me eating a little better according to her husband. I still think we need speak pathology to see what her swallowing is like and see if she can have regular food.  She will need a Foley catheter because we have to monitor her urine output and be able to check  her urine pH.  There is no obvious pain. She really is not out of bed. I discussed try to get her out of bed if possible.  She still needs a lot of physical therapy. I think that she still needs inpatient rehabilitation. We can get her over to inpatient rehabilitation, I hope, once she finishes this cycle of treatment.  I spoke to her husband today. I went over the chemotherapy with him. I went over the side effects. He understands all this. He is just happy that things are getting started.   She had lab work done yesterday. Everything looked okay from my point of view.  Her physical exam is pretty much unchanged. Her vital signs are temperature 98.6. Pulse 50. Blood pressure 136/50. Her head and neck exam shows no ocular or oral lesions. There are no palpable cervical or supraclavicular lymph nodes. She has the biopsy scar in the left frontal region. The staples were taken out. Lungs are clear bilaterally. Cardiac exam regular rate and rhythm with no murmurs, rubs or bruits. Abdomen is soft. She has good bowel sounds. There is no fluid wave. Extremities shows not much movement in the right arm. There is some movement in the right leg. Skin exam is without rashes.  Again, we will get started on chemotherapy. This will be high-dose methotrexate-based therapy. I will add vincristine. This gets some CNS penetration. She will also get Rituxan.  Afterwards, we will have to monitor methotrexate levels. She will be on leucovorin. I told all this to her husband. Again, he understands and  agrees to proceed.  I thank the nurses on 3 W. for their great care. Her husband is very appreciative of the compassion that is being shown to his wife.  Pete e.  Psalm 118:24

## 2014-12-20 NOTE — Progress Notes (Signed)
Pt's BSA/Rituxan dose and dilution verified with 2nd RN Dianna Torres.

## 2014-12-20 NOTE — Progress Notes (Signed)
Patient tolerating rituxan infusion well, no s/s of reaction, will continue infusion at 400mg /hr until complete. Will continue to monitor.

## 2014-12-20 NOTE — Evaluation (Signed)
Clinical/Bedside Swallow Evaluation Patient Details  Name: Tanya King MRN: [id number] Date of Birth: 1951-06-03  Today's Date: 12/20/2014 Time: SLP Start Time (ACUTE ONLY): 8341 SLP Stop Time (ACUTE ONLY): 0910 SLP Time Calculation (min) (ACUTE ONLY): 35 min  Past Medical History:  Past Medical History  Diagnosis Date  . Asthma   . Hypercholesteremia   . GERD (gastroesophageal reflux disease)   . Sleep apnea     AHI 116/hr now on BiPAP at 25/51mmHg  . Edema   . HTN (hypertension)   . Allergic rhinitis   . Vitamin B12 deficiency   . RLS (restless legs syndrome)   . Vitamin deficiency   . Obesity   . Aortic stenosis   . Edema     chronic LE edema  . Heart murmur     (mild AV stenosis with no regurg, mild MV sclerosis with mild MR, mod LVH 2012)  . Carotid artery occlusion     <30% bilateral ICA stenosis, no flow in left vertebral artery  . COPD (chronic obstructive pulmonary disease)     per spouse not diagnosed   Past Surgical History:  Past Surgical History  Procedure Laterality Date  . Tubal ligation    . Craniotomy N/A 11/27/2014    Procedure: LEFT BRAIN BIOPSY w/ Curve;  Surgeon: Newman Pies, MD;  Location: [place] [facility];  Service: Neurosurgery;  Laterality: N/A;  brain biopsy with brain lab   HPI:  Pt is a 64 yo female with recent admit to [facility] with right lower extremity weakness and slurred speech x2-3 weeks.  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  Pt dc'd home over the weekend and returned for brain biopsy to [facility] on Monday 11/30/2014.  Pt with mild bleed s/p brain bx with resultant dysarthria, increased right sided weakness, left hemianopsia and swallow evaluation ordered.  PMH + for smoking, asthma, HTN.  She has been seen for swalloiwng treatment *during previous admit for language as well.  Reeval ordered to determine readiness for dietary advancement.     Assessment / Plan / Recommendation Clinical Impression  Pt presents with improved  swallow ability compared to last week - suspect due to improved mentation.  Mild oropharyngeal dysphagia suspected with delayed oral transiting and slow mastication but no oral residuals.  Pt noted to be drooling (facial nerve impacted) on right upon SLP entrance to room and SLP set up yankeur and tubing for oral suctioning as indicated.  Again suspect pharyngeal swallow musculature strength intact but delayed.  Concern for at least laryngeal penetration/possible aspiration of nectar characterized by immediate throat clearing after swallow- therefore did not test thin liquids.      Spouse reports pt will on occasion cough with nectar currenlty and volitional cough is weak which subsequently increases aspiration risk.  Pt/spouse report goal is to advance solid foods.    Recommend advance diet to dys3/nectar with strict aspiration precautions.  Spouse present and was educated thoroughly to recommendations using teach back, demonstration for assurance of understanding.  SLP to follow up for readiness for liquid advancement.    Please order speech evaluation for speech/language.  Thanks for this order.      Aspiration Risk    Moderate and ongoing   Diet Recommendation Dysphagia 3 (Mechanical Soft);Nectar-thick liquid   Liquid Administration via: Spoon;Cup (no straws) Medication Administration: Whole meds with puree Supervision: Full supervision/cueing for compensatory strategies;Trained caregiver to feed patient Compensations: Slow rate;Small sips/bites;Check for pocketing (start with liquids)  Postural Changes and/or Swallow Maneuvers: Seated upright 90 degrees;Upright 30-60 min after meal    Other  Recommendations Oral Care Recommendations: Oral care before and after PO Other Recommendations: Order thickener from pharmacy   Follow Up Recommendations  Inpatient Rehab    Frequency and Duration min 2x/week  2 weeks   Pertinent Vitals/Pain Afebrile, decreased    Swallow Study Prior Functional  Status   see [place] Date of Onset: 12/20/14 HPI: Pt is a 65 yo female with recent admit to [facility] with right lower extremity weakness and slurred speech x2-3 weeks.  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  Pt dc'd home over the weekend and returned for brain biopsy to [facility] on Monday 12/11/2014.  Pt with mild bleed s/p brain bx with resultant dysarthria, increased right sided weakness, left hemianopsia and swallow evaluation ordered.  PMH + for smoking, asthma, HTN.  She has been seen for swalloiwng treatment *during previous admit for language as well.  Reeval ordered to determine readiness for dietary advancement.   Type of Study: Bedside swallow evaluation Diet Prior to this Study: Dysphagia 1 (puree);Nectar-thick liquids Temperature Spikes Noted: No Respiratory Status: Nasal cannula History of Recent Intubation: No Behavior/Cognition: Decreased sustained attention;Distractible (delayed responses) Oral Cavity - Dentition: Adequate natural dentition Self-Feeding Abilities: Needs assist Patient Positioning: Upright in bed Baseline Vocal Quality: Low vocal intensity;Clear Volitional Cough: Weak (weak and nonprotective cough) Volitional Swallow: Unable to elicit    Oral/Motor/Sensory Function Overall Oral Motor/Sensory Function: Impaired Labial ROM: Reduced right Labial Symmetry: Abnormal symmetry right Labial Strength: Reduced Labial Sensation: Reduced Lingual ROM: Reduced right Lingual Symmetry: Within Functional Limits Lingual Strength: Reduced Facial ROM: Reduced right Facial Symmetry: Right droop Facial Strength: Reduced Facial Sensation: Reduced Velum: Impaired left;Impaired right (sluggish when elicited with phonation)   Ice Chips Ice chips: Not tested   Thin Liquid Thin Liquid: Not tested    Nectar Thick Nectar Thick Liquid: Impaired Presentation: Cup;Spoon Oral Phase Impairments: Reduced lingual movement/coordination;Impaired anterior to posterior  transit;Reduced labial seal Oral phase functional implications: Prolonged oral transit;Other (comment) Pharyngeal Phase Impairments: Suspected delayed Swallow;Throat Clearing - Immediate   Honey Thick Honey Thick Liquid: Not tested   Puree Puree: Impaired Presentation: Spoon Oral Phase Impairments: Reduced lingual movement/coordination;Impaired anterior to posterior transit Oral Phase Functional Implications: Prolonged oral transit Pharyngeal Phase Impairments: Suspected delayed Swallow   Solid   GO    Solid: Impaired Oral Phase Impairments: Impaired anterior to posterior transit;Reduced lingual movement/coordination;Impaired mastication;Reduced labial seal Pharyngeal Phase Impairments: Suspected delayed Elonda Husky, [place] [facility] SLP [phone]

## 2014-12-20 NOTE — Progress Notes (Signed)
PT Cancellation Note  Patient Details Name: Tanya King MRN: [id number] DOB: 28-Dec-1950   Cancelled Treatment:     per RN this is pt's first CHEMO infusion which will last 6 - 8 hours with close monitoring for any reaction.  Will check back another day as schedule permits.   Nathanial Rancher 12/20/2014, 10:56 AM

## 2014-12-20 NOTE — Progress Notes (Signed)
OT Cancellation Note  Patient Details Name: Gwenda Heiner MRN: [id number] DOB: 26-Sep-1951   Cancelled Treatment:    Reason Eval/Treat Not Completed: Other (comment) .  Pt is still getting first chemo and is somulent.  Will check back tomorrow as schedule permits.    Thane Age 64/10/2014, 2:57 PM  Lesle Chris, OTR/L [phone] 12/20/2014

## 2014-12-20 NOTE — Progress Notes (Signed)
Patient tolerating rituxan infusion well, no s/s of reaction noted. Rate increased to 400mg /hr. Will continue to monitor.

## 2014-12-20 NOTE — Progress Notes (Signed)
Patient tolerating rituxan infusion well, no s/s of reaction noted.  Rate increased to 250mg /hr. Will continue to monitor.

## 2014-12-20 NOTE — Progress Notes (Signed)
Patient tolerating infusion well.  No sign of reaction.  Rate increased to 350mg /hr

## 2014-12-20 NOTE — Care Management Note (Signed)
CARE MANAGEMENT NOTE 12/20/2014  Patient:  Tanya King, Tanya King   Account Number:  [id number]  Date Initiated:  12/13/2014  Documentation initiated by:  Sandi Mariscal  Subjective/Objective Assessment:   to hospital 2/17-2/20 King/ neuro changes; found to have brain mass; returned this admit for stereotactic bx; increased dysarthrias postop, no swallow     Action/Plan:   therapy evals to be completed--potential for need for rehab at d/c   Anticipated DC Date:  12/24/2014   Anticipated DC Plan:  IP REHAB FACILITY         Choice offered to / List presented to:             Status of service:  In process, will continue to follow Medicare Important Message given?   (If response is "NO", the following Medicare IM given date fields will be blank) Date Medicare IM given:   Medicare IM given by:   Date Additional Medicare IM given:   Additional Medicare IM given by:    Discharge Disposition:    Per UR Regulation:  Reviewed for med. necessity/level of care/duration of stay  If discussed at [facility] of Stay Meetings, dates discussed:    Comments:  12/20/14 Marney Doctor RN,BSN,NCM [phone] Pt transferred to [facility] to start chemo treatment.  CM will continue to follow and assist with disposition needs.  12/16/2014- PATIENT IS FOR BONE MARROW BIOPSY  FOR CNS LYMPHOMA; Normanna RN,BSN,MHA [phone]

## 2014-12-20 NOTE — Progress Notes (Signed)
Rituximab infusion started at 50 mg/hr. Will continue to monitor.

## 2014-12-20 NOTE — Procedures (Signed)
Pt placed on hospital cpap machine using the auto mode. Max-20 and min-5 with 4L on oxygen.  Pt resting comfortably at this time. RT will continue to monitor.

## 2014-12-20 DEATH — deceased

## 2014-12-21 DIAGNOSIS — D751 Secondary polycythemia: Secondary | ICD-10-CM

## 2014-12-21 DIAGNOSIS — Z87891 Personal history of nicotine dependence: Secondary | ICD-10-CM

## 2014-12-21 DIAGNOSIS — D72829 Elevated white blood cell count, unspecified: Secondary | ICD-10-CM

## 2014-12-21 DIAGNOSIS — R531 Weakness: Secondary | ICD-10-CM

## 2014-12-21 DIAGNOSIS — Z5112 Encounter for antineoplastic immunotherapy: Secondary | ICD-10-CM

## 2014-12-21 LAB — URINALYSIS, DIPSTICK ONLY
BILIRUBIN URINE: NEGATIVE
BILIRUBIN URINE: NEGATIVE
Bilirubin Urine: NEGATIVE
Glucose, UA: NEGATIVE mg/dL
Glucose, UA: NEGATIVE mg/dL
Glucose, UA: NEGATIVE mg/dL
Hgb urine dipstick: NEGATIVE
Hgb urine dipstick: NEGATIVE
Hgb urine dipstick: NEGATIVE
KETONES UR: NEGATIVE mg/dL
KETONES UR: NEGATIVE mg/dL
Ketones, ur: NEGATIVE mg/dL
LEUKOCYTES UA: NEGATIVE
LEUKOCYTES UA: NEGATIVE
Leukocytes, UA: NEGATIVE
NITRITE: NEGATIVE
NITRITE: NEGATIVE
Nitrite: NEGATIVE
PH: 7.5 (ref 5.0–8.0)
PH: 8 (ref 5.0–8.0)
PROTEIN: NEGATIVE mg/dL
Protein, ur: NEGATIVE mg/dL
Protein, ur: NEGATIVE mg/dL
Specific Gravity, Urine: 1.022 (ref 1.005–1.030)
Specific Gravity, Urine: 1.024 (ref 1.005–1.030)
Specific Gravity, Urine: 1.023 (ref 1.005–1.030)
UROBILINOGEN UA: 1 mg/dL (ref 0.0–1.0)
Urobilinogen, UA: 1 mg/dL (ref 0.0–1.0)
Urobilinogen, UA: 1 mg/dL (ref 0.0–1.0)
pH: 7.5 (ref 5.0–8.0)

## 2014-12-21 MED ORDER — VINCRISTINE SULFATE CHEMO INJECTION 1 MG/ML
2.0000 mg | Freq: Once | INTRAVENOUS | Status: AC
  Administered 2014-12-21: 2 mg via INTRAVENOUS
  Filled 2014-12-21: qty 2

## 2014-12-21 MED ORDER — SODIUM CHLORIDE 0.9 % IV SOLN
Freq: Once | INTRAVENOUS | Status: AC
  Administered 2014-12-21: 8 mg via INTRAVENOUS
  Filled 2014-12-21: qty 4

## 2014-12-21 NOTE — Progress Notes (Signed)
Physical Therapy Treatment Patient Details Name: Tanya King MRN: [id number] DOB: 06-14-1951 Today's Date: 12/21/2014    History of Present Illness This 64 y.o. femlae admitted 11/29/2014 with AMS, Rt hemiparesis, dyarthria and apraxia.  MRI showed multiple intracranial lesions.  Metastatic worku p done thus far has been negative.  She underwent stereotatic brain biopsy 12/16/2014 with pathology results pending.  PMH includes: aortic stenosis; asthma; HTN    PT Comments    Patient assisted to sitting on the edge of the bed x 10 minutes. Patient sat with min guard at midline with support  From LUE.   Follow Up Recommendations  CIR;SNF     Equipment Recommendations  Wheelchair (measurements PT);Wheelchair cushion (measurements PT)    Recommendations for Other Services       Precautions / Restrictions Precautions Precautions: Fall Precaution Comments: dense R hemiplegia    Mobility  Bed Mobility Overal bed mobility: Needs Assistance Bed Mobility: Supine to Sit;Sit to Supine     Supine to sit: Max assist;HOB elevated Sit to supine: Max assist   General bed mobility comments: use of purple slide under buttocks to facilitate turning  t, assist with R leg and R UE and trunk , patient used Rail on  Left to assist self to sitting.  Transfers                    Ambulation/Gait                 Stairs            Wheelchair Mobility    Modified Rankin (Stroke Patients Only)       Balance Overall balance assessment: Needs assistance Sitting-balance support: Single extremity supported;Feet supported;Bilateral upper extremity supported Sitting balance-Leahy Scale: Fair Sitting balance - Comments: sat at midline while holding onto Bed rail on L, RUE supported on pillows, able to sit in this manner and perform  active L hip flexion and knee extension and PROM to R LE and held self upright. . Did sit without L UE  for  3 minutes with minimal listing to the R.,  Required max assistance for R LE onto be. Bed placed in trendelenburg  and patient able to use LUE on rail and assist sliding up to [facility] with purple slide under buttocks.                            Cognition Arousal/Alertness: Awake/alert Behavior During Therapy: Flat affect                   General Comments: Pt does follow one step functional commands in context of activity     Exercises      General Comments        Pertinent Vitals/Pain Pain Assessment: No/denies pain    Home Living                      Prior Function            PT Goals (current goals can now be found in the care plan section) Progress towards PT goals: Progressing toward goals    Frequency  Min 3X/week    PT Plan Current plan remains appropriate    Co-evaluation             End of Session Equipment Utilized During Treatment: Oxygen Activity Tolerance: Patient tolerated treatment well Patient left: in bed;with bed alarm  set     Time: 8177-1165 PT Time Calculation (min) (ACUTE ONLY): 24 min  Charges:  $Therapeutic Activity: 23-37 mins                    G Codes:      Claretha Cooper 12/21/2014, 5:43 PM Tresa Endo PT [phone]

## 2014-12-21 NOTE — Progress Notes (Signed)
Received order for speech/language evaluation.  Thanks.  Will plan to conduct next date. Luanna Salk, [place] [facility] SLP [phone]

## 2014-12-21 NOTE — Progress Notes (Signed)
Occupational Therapy Treatment Patient Details Name: Tanya King MRN: [id number] DOB: July 30, 1951 Today's Date: 12/21/2014    History of present illness This 64 y.o. femlae admitted 11/30/2014 with AMS, Rt hemiparesis, dyarthria and apraxia.  MRI showed multiple intracranial lesions.  Metastatic worku p done thus far has been negative.  She underwent stereotatic brain biopsy 12/16/2014 with pathology results pending.  PMH includes: aortic stenosis; asthma; HTN   OT comments  Pt is very motivated and alert for entire session.  Affect brightened when niece arrived.  Following most commands with multimodal cues.  Pt did demonstrate a little apraxia with lotion.  Follow Up Recommendations  CIR    Equipment Recommendations  3 in 1 bedside comode;Wheelchair (measurements OT);Hospital bed    Recommendations for Other Services Rehab consult    Precautions / Restrictions Precautions Precautions: Fall       Mobility Bed Mobility         Supine to sit: Max assist;HOB elevated Sit to supine: Max assist;HOB elevated   General bed mobility comments: pt used bedrail on L for bed mobility.  Utilized HOB up and bed pad to assist to EOB. +2 to reposition back in the bed  Transfers                      Balance   Sitting-balance support: Bilateral upper extremity supported;Feet supported Sitting balance-Leahy Scale: Poor Sitting balance - Comments: Pt unexpectedly loses balance backwards--requiring min A to correct.  Attempted RUE weightbearing                           ADL       Grooming: Wash/dry hands;Wash/dry face;Minimal assistance;Bed level (HOB raised)                                 General ADL Comments: Pt followed motor commands in addition to functional commands.  Used multimodal cues initially. (squeeze hand; rub hands together with lotion, lift head).  Sat EOB with max A.  Used HOB raised and pad to assist. Pt moved L side of body and used  rail.  Attempted weight bearing.  Pt's RUE heavy/dense hemiplegia.  Attempted to have her hold lotion bottle--able to use only as stabilizer.  Pt very attentive visually during PROM.  No c/o pain, no dizziness when asked when sitting EOB. Sat with min guard for 4 minutes.  Pt did attempt to bring lotion to mouth when first applied--apraxic.  Blocked arm and she was able to bring this to her hand.      Vision                     Perception     Praxis      Cognition   Behavior During Therapy: [facility] for tasks assessed/performed (affect brightened when niece arrived) Overall Cognitive Status: Difficult to assess (answers yes/no)                       Extremity/Trunk Assessment               Exercises Other Exercises Other Exercises: PROM RUE in bed:  arm is heavy/dense hemiplegia.  Performed retrograde massage and positioning for edema   Shoulder Instructions       General Comments      Pertinent Vitals/ Pain  Pain Assessment: No/denies pain  Home Living                                          Prior Functioning/Environment              Frequency Min 2X/week     Progress Toward Goals  OT Goals(current goals can now be found in the care plan section)  Progress towards OT goals: Progressing toward goals     Plan Discharge plan remains appropriate    Co-evaluation                 End of Session     Activity Tolerance Patient tolerated treatment well   Patient Left in bed;with call bell/phone within reach;with family/visitor present (family requests 4 rails (niece))   Nurse Communication          Time: [phone] OT Time Calculation (min): 31 min  Charges: OT General Charges $OT Visit: 1 Procedure OT Treatments $Therapeutic Activity: 8-22 mins $Neuromuscular Re-education: 8-22 mins  Tayshawn Purnell 12/21/2014, 11:17 AM Lesle Chris, OTR/L [phone] 12/21/2014

## 2014-12-21 NOTE — Consult Note (Signed)
Tanya King   DOB:14-Mar-1951   TI#:[id number]   ASN#:[id number]  Patient Care Team: Lilian Coma, MD as PCP - General (Family Medicine)  Subjective: Patient tolerated date 1 cycle 1 of chemotherapy well. Denies fevers, chills, night sweats, vision changes, or mucositis. Denies any respiratory complaints. Denies any chest pain or palpitations. Denies lower extremity swelling. Denies nausea, heartburn or change in bowel habits.. Denies any dysuria. Denies abnormal skin rashes, or neuropathy. Denies any bleeding issues such as epistaxis, hematemesis, hematuria or hematochezia. Right-sided weakness is improving   Scheduled Meds: . antiseptic oral rinse  7 mL Mouth Rinse BID  . dexamethasone  8 mg Oral 3 times per day  . docusate sodium  100 mg Oral BID  . enoxaparin (LOVENOX) injection  40 mg Subcutaneous Daily  . famciclovir  500 mg Oral Daily  . fluconazole  100 mg Oral Daily  . losartan  100 mg Oral Daily   And  . hydrochlorothiazide  12.5 mg Oral Daily  . levETIRAcetam  500 mg Intravenous Q12H  . mometasone-formoterol  2 puff Inhalation BID  . ondansetron (ZOFRAN) with dexamethasone (DECADRON) IV   Intravenous Once  . pantoprazole  40 mg Oral QHS  . sodium bicarbonate/sodium chloride   Mouth Rinse Q6H  . vinCRIStine (ONCOVIN) CHEMO IV infusion  2 mg Intravenous Once   Continuous Infusions: . sodium chloride 10 mL/hr at 12/20/14 1029  .  sodium bicarbonate infusion 1/4 NS 1000 mL 125 mL/hr at 12/21/14 0245   PRN Meds:acetaminophen **OR** acetaminophen, albuterol, HYDROcodone-acetaminophen, morphine injection, ondansetron **OR** ondansetron (ZOFRAN) IV, promethazine, RESOURCE THICKENUP CLEAR, sodium chloride, sodium chloride, sodium chloride   Objective:  Filed Vitals:   12/21/14 0553  BP: 144/56  Pulse: 52  Temp: 98.3 F (36.8 C)  Resp: 16      Intake/Output Summary (Last 24 hours) at 12/21/14 1135 Last data filed at 12/21/14 0600  Gross per 24 hour  Intake 1318.37  ml  Output    700 ml  Net 618.37 ml     GENERAL:alert, no distress and comfortable, Somewhat somnolent SKIN: skin color, texture, turgor are normal, no rashes or significant lesions. Well-healed left frontal scar EYES: normal, conjunctiva are pink and non-injected, sclera clear OROPHARYNX:no exudate, no erythema and lips, buccal mucosa, and tongue normal  NECK: supple, thyroid normal size, non-tender, without nodularity LYMPH:  no palpable lymphadenopathy in the cervical, axillary or inguinal LUNGS: clear to auscultation and percussion with normal breathing effort HEART: regular rate & rhythm and no murmurs and no lower extremity edema ABDOMEN: soft, non-tender and normal bowel sounds Musculoskeletal:no cyanosis of digits and no clubbing  PSYCH: alert & oriented x 3 with fluent speech NEURO: Improved right-sided weakness   CBG (last 3)  No results for input(s): GLUCAP in the last 72 hours.   Labs:   Recent Labs Lab 12/16/14 0854 12/17/14 0550 12/19/14 0500  WBC 13.0* 11.9* 14.8*  HGB 18.1* 16.7* 17.4*  HCT 52.0* 49.6* 51.3*  PLT 112* 112* 82*  MCV 90.9 91.0 92.6  MCH 31.6 30.6 31.4  MCHC 34.8 33.7 33.9  RDW 14.1 14.1 14.0     Chemistries:    Recent Labs Lab 12/16/14 0854 12/17/14 0550 12/19/14 0500  NA 137 136 138  K 4.5 5.4* 4.7  CL 103 101 99  CO2 25 25 32  GLUCOSE 166* 84 122*  BUN 23 23 23   CREATININE 0.92 0.76 0.83  CALCIUM 8.4 8.6 8.6  AST  --  14 15  ALT  --  16 17  ALKPHOS  --  49 50  BILITOT  --  0.8 0.7    GFR Estimated Creatinine Clearance: 94.3 mL/min (by C-G formula based on Cr of 0.83).  Liver Function Tests:  Recent Labs Lab 12/17/14 0550 12/19/14 0500  AST 14 15  ALT 16 17  ALKPHOS 49 50  BILITOT 0.8 0.7  PROT 5.4* 5.5*  ALBUMIN 2.7* 2.6*   No results for input(s): LIPASE, AMYLASE in the last 168 hours. No results for input(s): AMMONIA in the last 168 hours.  Urine Studies     Component Value Date/Time   COLORURINE  YELLOW 12/07/2014 2030   APPEARANCEUR CLOUDY* 12/07/2014 2030   LABSPEC 1.024 12/21/2014 0952   PHURINE 7.5 12/21/2014 0952   GLUCOSEU NEGATIVE 12/21/2014 0952   HGBUR NEGATIVE 12/21/2014 0952   BILIRUBINUR NEGATIVE 12/21/2014 0952   KETONESUR NEGATIVE 12/21/2014 0952   PROTEINUR NEGATIVE 12/21/2014 0952   UROBILINOGEN 1.0 12/21/2014 0952   NITRITE NEGATIVE 12/21/2014 0952   LEUKOCYTESUR NEGATIVE 12/21/2014 0952    Coagulation profile  Recent Labs Lab 12/16/14 0854  INR 1.09      Imaging Studies:  Ct Biopsy  12/19/2014   CLINICAL DATA:  Lymphoma  EXAM: CT-GUIDED BONE MARROW ASPIRATE AND CORE BIOPSY.  MEDICATIONS AND MEDICAL HISTORY: Versed 1 mg, Fentanyl 50 mcg.  Additional Medications: None.  ANESTHESIA/SEDATION: Moderate sedation time: 10 minutes  PROCEDURE: The procedure, risks, benefits, and alternatives were explained to the patient. Questions regarding the procedure were encouraged and answered. The patient understands and consents to the procedure.  The back was prepped with Betadine in a sterile fashion, and a sterile drape was applied covering the operative field. A sterile gown and sterile gloves were used for the procedure.  Under CT guidance, an 11 gauge needle was inserted into the left iliac bone, via posterior approach. Aspirates and a core were obtained. Final imaging was performed.  Patient tolerated the procedure well without complication. Vital sign monitoring by nursing staff during the procedure will continue as patient is in the special procedures unit for post procedure observation.  FINDINGS: The images document guide needle placement within the left iliac bone. Post biopsy images demonstrate no hemorrhage.  COMPLICATIONS: None  IMPRESSION: Successful CT-guided bone marrow aspirate and core from the left iliac bone.   Electronically Signed   By: Marybelle Killings M.D.   On: 12/19/2014 13:02    Assessment/Plan: 64 y.o.    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 She tolerated day 1 cycle 1 well. Will continue chemotherapy as scheduled.  After this is completed, we will continue to monitor her methotrexate levels. She will be on leucovorin.  Right-sided weakness Due to #1 Appreciate OT-PT follow-up to help her improve her ambulation and balance  Leukocytosis This is likely due to steroids, and a history of tobacco abuse in the recent past No intervention is indicated at this time  Erythrocytosis This is likely reactive, and has been present since admission, in the setting of recent tobacco abuse 9 to arrange is indicated at this time, as these values are likely to drop during chemotherapy.  continue to monitor   DVT prophylaxis  On Lovenox Current platelet count is 87,000, can hold Lovenox for platelet count less than 50,000 No bleeding issues are noted  Full Code    **Disclaimer: This note was dictated with voice recognition software. Similar sounding words can inadvertently be transcribed and this note may contain transcription errors which may not have been corrected upon publication of note.** WERTMAN,SARA E, PA-C 12/21/2014  11:35 AM  ADDENDUM:  I agree with the above.  Rituxan went well.  To start actual chemo today.    Urine is alkaline.  Need to tryto get out of bed a little more, if possible.  I appreciate speech path trying to help out!!  Will check labs.  Laurey Arrow

## 2014-12-21 NOTE — Procedures (Signed)
Pt refuses cpap for the night 

## 2014-12-21 NOTE — Progress Notes (Signed)
Speech Language Pathology Treatment: Dysphagia  Patient Details Name: Tanya King MRN: [id number] DOB: 08-10-1951 Today's Date: 12/21/2014 Time: 6979-4801 SLP Time Calculation (min) (ACUTE ONLY): 22 min  Assessment / Plan / Recommendation Clinical Impression  Pt tolerating diet well per pt and spouse.  Reports satisfaction with diet advancement - and cough x2 only with food yesterday.  SLP thickened diet soda for pt and demonstrated to [facility] *neice and spouse process of thickening to allow pt more fluids than only during meals.  Delayed cough x1 noted but pt with cough at baseline ? Secretions.  Recommend continue diet with strict aspiration precautions.  Spouse and pt agreeable to plan.   MD please order speech evaluation to help maximize pt's communication.  Improved expressive language noted today - pt speaking at phrase level.    HPI HPI: Pt is a 64 yo female with recent admit to [facility] with right lower extremity weakness and slurred speech x2-3 weeks.  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  Pt dc'd home over the weekend and returned for brain biopsy to [facility] on Monday 12/11/2014.  Pt with mild bleed s/p brain bx with resultant dysarthria, increased right sided weakness, left hemianopsia and swallow evaluation ordered.  PMH + for smoking, asthma, HTN.  She has been seen for swalloiwng treatment *during previous admit for language as well.  Reeval ordered to determine readiness for dietary advancement.     Pertinent Vitals Pain Assessment: No/denies pain  SLP Plan  Continue with current plan of care    Recommendations Diet recommendations: Dysphagia 3 (mechanical soft);Nectar-thick liquid Liquids provided via: Cup;Teaspoon Medication Administration: Whole meds with puree Supervision: Full supervision/cueing for compensatory strategies;Trained caregiver to feed patient Compensations: Slow rate;Small sips/bites;Check for pocketing (start with liquids) Postural Changes and/or  Swallow Maneuvers: Seated upright 90 degrees;Upright 30-60 min after meal              Oral Care Recommendations: Oral care before and after PO Follow up Recommendations: Inpatient Rehab Plan: Continue with current plan of care    [place], [place] [facility] SLP [phone]

## 2014-12-21 NOTE — Progress Notes (Signed)
Patient's BSA and Vincristine dosage verified with Drue Dun, RN

## 2014-12-22 DIAGNOSIS — D696 Thrombocytopenia, unspecified: Secondary | ICD-10-CM

## 2014-12-22 LAB — COMPREHENSIVE METABOLIC PANEL
ALK PHOS: 48 U/L (ref 39–117)
ALT: 17 U/L (ref 0–35)
AST: 17 U/L (ref 0–37)
Albumin: 2.6 g/dL — ABNORMAL LOW (ref 3.5–5.2)
Anion gap: 7 (ref 5–15)
BUN: 24 mg/dL — AB (ref 6–23)
CHLORIDE: 96 mmol/L (ref 96–112)
CO2: 32 mmol/L (ref 19–32)
CREATININE: 0.71 mg/dL (ref 0.50–1.10)
Calcium: 8.1 mg/dL — ABNORMAL LOW (ref 8.4–10.5)
GFR calc Af Amer: >90 mL/min (ref >90)
GFR calc non Af Amer: >90 mL/min (ref >90)
Glucose, Bld: 124 mg/dL — ABNORMAL HIGH (ref 70–99)
Potassium: 4.3 mmol/L (ref 3.5–5.1)
Sodium: 135 mmol/L (ref 135–145)
Total Bilirubin: 0.8 mg/dL (ref 0.3–1.2)
Total Protein: 5.3 g/dL — ABNORMAL LOW (ref 6.0–8.3)

## 2014-12-22 LAB — URINALYSIS, DIPSTICK ONLY
BILIRUBIN URINE: NEGATIVE
Bilirubin Urine: NEGATIVE
Glucose, UA: NEGATIVE mg/dL
Glucose, UA: NEGATIVE mg/dL
Hgb urine dipstick: NEGATIVE
Ketones, ur: NEGATIVE mg/dL
Ketones, ur: NEGATIVE mg/dL
NITRITE: NEGATIVE
Nitrite: POSITIVE — AB
PH: 8 (ref 5.0–8.0)
PROTEIN: 30 mg/dL — AB
Protein, ur: NEGATIVE mg/dL
SPECIFIC GRAVITY, URINE: 1.018 (ref 1.005–1.030)
SPECIFIC GRAVITY, URINE: 1.025 (ref 1.005–1.030)
Urobilinogen, UA: 1 mg/dL (ref 0.0–1.0)
Urobilinogen, UA: 1 mg/dL (ref 0.0–1.0)
pH: 7.5 (ref 5.0–8.0)

## 2014-12-22 LAB — CBC
HCT: 47.5 % — ABNORMAL HIGH (ref 36.0–46.0)
HEMOGLOBIN: 15.5 g/dL — AB (ref 12.0–15.0)
MCH: 30.5 pg (ref 26.0–34.0)
MCHC: 32.6 g/dL (ref 30.0–36.0)
MCV: 93.5 fL (ref 78.0–100.0)
PLATELETS: 81 10*3/uL — AB (ref 150–400)
RBC: 5.08 MIL/uL (ref 3.87–5.11)
RDW: 13.8 % (ref 11.5–15.5)
WBC: 17 10*3/uL — ABNORMAL HIGH (ref 4.0–10.5)

## 2014-12-22 MED ORDER — METHOTREXATE SODIUM CHEMO INJECTION 25 MG/ML PF
13.0000 g | Freq: Once | INTRAMUSCULAR | Status: AC
  Administered 2014-12-22: 13 g via INTRAVENOUS
  Filled 2014-12-22: qty 520

## 2014-12-22 MED ORDER — SODIUM CHLORIDE 0.9 % IV SOLN
INTRAVENOUS | Status: AC
  Administered 2014-12-22 – 2014-12-23 (×2): 16 mg via INTRAVENOUS
  Filled 2014-12-22 (×2): qty 8

## 2014-12-22 MED ORDER — LEUCOVORIN CALCIUM INJECTION 100 MG: 15.0000 mg/m2 | Freq: Four times a day (QID) | INTRAMUSCULAR | Status: DC

## 2014-12-22 MED ORDER — SODIUM CHLORIDE 0.9 % IV SOLN
3.5000 g | Freq: Once | INTRAVENOUS | Status: AC
  Administered 2014-12-22: 3.5 g via INTRAVENOUS
  Filled 2014-12-22: qty 140

## 2014-12-22 MED ORDER — LEUCOVORIN CALCIUM INJECTION 100 MG
15.0000 mg/m2 | INTRAMUSCULAR | Status: DC
  Administered 2014-12-24 (×6): 36 mg via INTRAVENOUS
  Filled 2014-12-22 (×9): qty 1.8

## 2014-12-22 NOTE — Evaluation (Signed)
SLP Cancellation Note  Patient Details Name: Tanya King MRN: [id number] DOB: October 21, 1951   Cancelled treatment:       Reason Eval/Treat Not Completed: Other (comment) (pt sleeping soundly, will return next date for eval)  Luanna Salk, [place] [facility] SLP [phone]

## 2014-12-22 NOTE — Progress Notes (Signed)
Tanya King has done well so far. She's had Rituxan and vincristine. She's had no issues.  She will start methotrexate infusion today.  Her urine is alkaline. She has a good urine output. She has a Foley catheter and that we have to keep in the monitor her urine output.  There still is very little movement in her right arm. I know that physical therapy is trying to work with her.  She does have the thrombocytopenia. I have to believe that this is from medications. She did have a bone marrow test done which was negative.  She's had no seizures.  She was able to sit on the side of the bed according to her husband yesterday with the help of physical therapy.  Her appetite has been good. She's had no nausea or vomiting. She's had no issues with dysphasia. She is on a dysphagia 3 diet.  There's been no fever.  On physical exam, temperature 97.5. Pulse 50. Blood pressure 128/48. Head and neck exam shows no ocular or oral lesions. There are no palpable cervical or supraclavicular lymph nodes. Lungs are clear. Cardiac exam regular rate and rhythm with no murmurs, rubs or bruits. Abdomen is soft. She has good bowel sounds. She is somewhat obese. Extremities shows no movement in the right arm. There is some movement in the right leg. Skin exam shows no rashes.  On her labs, her white cell count is 17,000. Hemoglobin 15.5. Platelet count 81,000. Sodium 135. Potassium 4.3. BUN 24 and creatinine 0.71.   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 She has started her chemotherapy. She'll will receive methotrexate today.  So far, she has done well.  We will have to monitor her methotrexate levels once she completes the infusion. She will be on leucovorin rescue.  We will check her labs tomorrow.  The staff on [address] is doing a fantastic job with her. Her husband is very happy and very pleased with the compassion that the staff has shown his wife.  Pete E.  Ephesians 4:32

## 2014-12-22 NOTE — Progress Notes (Signed)
Manual calculation of BSA and dosing for Methotrexate completed.  Second verification by Aldean Baker

## 2014-12-22 NOTE — Progress Notes (Signed)
Physical Therapy Treatment Patient Details Name: Tanya King MRN: [id number] DOB: [birth year] Today's Date: 12/22/2014    History of Present Illness This 64 y.o. femlae admitted 11/27/2014 with AMS, Rt hemiparesis, dyarthria and apraxia.  MRI showed multiple intracranial lesions.  Metastatic worku p done thus far has been negative.  She underwent stereotatic brain biopsy 12/11/2014 with pathology results pending.  PMH includes: aortic stenosis; asthma; HTN    PT Comments    Pt in bed with spouse Dominica Severin in room.  Pt alert and speaking one/two words appropriately and sustaining alertness.  Assisted from supine to EOB total assist pt only 15% L side only.  Severe dense R hemi UE/LE.  Poor sitting balance requiring Max Assist to prevent LOB all planes.  Performed static and dynamic sitting balance activities up to 10 min before pt started to show signs of fatigue.  Anterior/posterior shifts and lateral shifts while supporting at max assist as pt demonstrated LOB all planes.  Unable to attempt any standing activities due to low level performance with sitting.  Assisted to recliner via [place].  Increased time to position to upright/midline with multiple pillows.  Lunch tray arrived just in time so set her up as spouse assisted.  Notified RN pt is a lift back to bed.   Follow Up Recommendations  CIR;SNF     Equipment Recommendations  Wheelchair (measurements PT);Wheelchair cushion (measurements PT)    Recommendations for Other Services       Precautions / Restrictions Precautions Precautions: Fall Precaution Comments: dense R hemiplegia Restrictions Weight Bearing Restrictions: No    Mobility  Bed Mobility Overal bed mobility: Needs Assistance Bed Mobility: Supine to Sit;Sit to Supine     Supine to sit: +2 for physical assistance;+2 for safety/equipment;Total assist (pt 15% with L side) Sit to supine: +2 for physical assistance;+2 for safety/equipment;Total assist (pt 10% L side only)    General bed mobility comments: pt able to push hips 15% L side only and requires total assist for R side.  Transitioned from supine to EOB Total Assist.  Once upright and B feet coorectly placed pt sat EOB x 10 min at max assist while performing srtatic and dynamic sitting TE's.  Anterior/posterior shift plus lateral.  Performed cross midlline reaching  of onject using L UE PNF pattern x 8 reps before pt statrted to c/o of fatigue.    Transfers                 General transfer comment: used a HOYER lift to transfer pt from bed to recliner + 2 asisst.  Increased time needed to position in recliner with multiple pillows.    Ambulation/Gait         Gait velocity: pt non amb at this time.        Stairs            Wheelchair Mobility    Modified Rankin (Stroke Patients Only)       Balance                                    Cognition Arousal/Alertness: Awake/alert Behavior During Therapy: Flat affect Overall Cognitive Status: Impaired/Different from baseline Area of Impairment: Problem solving   Current Attention Level: Sustained           General Comments: following multiple commands if repeated and time allowed to process.  Verbalizing more one/two woords.  Exercises      General Comments        Pertinent Vitals/Pain Pain Assessment: No/denies pain    Home Living                      Prior Function            PT Goals (current goals can now be found in the care plan section)      Frequency  Min 3X/week    PT Plan      Co-evaluation             End of Session Equipment Utilized During Treatment: Oxygen Activity Tolerance: Patient limited by fatigue;Treatment limited secondary to medical complications (Comment) Patient left: in chair;with call bell/phone within reach;with family/visitor present     Time: 1135-1200 PT Time Calculation (min) (ACUTE ONLY): 25 min  Charges:  $Therapeutic Activity: 23-37  mins                    G Codes:      Rica Koyanagi  PTA [facility]  Acute  Rehab Pager      [phone]

## 2014-12-22 NOTE — Evaluation (Signed)
SLP Cancellation Note  Patient Details Name: Tanya King MRN: [id number] DOB: Jan 05, 1951   Cancelled treatment:       Reason Eval/Treat Not Completed: Other (comment) (pt getting ready to work with PT, will reattempt eval at another time)   Luanna Salk, [facility] SLP [phone]

## 2014-12-22 NOTE — Progress Notes (Signed)
Manual calculation and verification of BSA and dosing for bolus bag of Methotrexate. Second verification by Wilkie Aye RN

## 2014-12-22 NOTE — Progress Notes (Signed)
Occupational Therapy Treatment Patient Details Name: Tanya King MRN: [id number] DOB: Nov 29, 1950 Today's Date: 12/22/2014    History of present illness This 64 y.o. femlae admitted 11/23/2014 with AMS, Rt hemiparesis, dyarthria and apraxia.  MRI showed multiple intracranial lesions.  Metastatic worku p done thus far has been negative.  She underwent stereotatic brain biopsy 12/15/2014 with pathology results pending.  PMH includes: aortic stenosis; asthma; HTN   OT comments  Pt participated well in OT.  She had been sitting up in chair and was uncomfortable.  When placed in bed via lift, pt got sleepy.  Pt much more talkative.  Follow Up Recommendations  CIR;SNF    Equipment Recommendations   (if home, wheelchair, hospital bed, mechanical lift and 3:1)    Recommendations for Other Services      Precautions / Restrictions Precautions Precautions: Fall Precaution Comments: dense R hemiplegia Restrictions Weight Bearing Restrictions: No       Mobility Bed Mobility   Rolling: Mod assist;Total assist (mod to R; total to L)         Transfers                     Balance                                   ADL       Grooming: Minimal assistance (washed face, lotion to hands)                                 General ADL Comments: assisted NT with maximove and worked on rolling to remove pads.  Pt assists rolling to R.  Husband reports that pt self fed with LUE.  She has built up foam at home and didn't like it.  Pt performed grooming as above:  attempted to bring lotion to face but not mouth today.  Assisted with brushing hair (total) due to tangles, recent sx.  Pt was able to turn eyes to both sides and identify objects.  More talkative.  Pt fatiqued after returning to bed.  Attempted to elicit AROM from RUE but could not.  Performed retrograde massage and positioned RUE for edema management.  Sign posted in room      Vision                      Perception     Praxis      Cognition   Behavior During Therapy: Flat affect Overall Cognitive Status: Impaired/Different from baseline Area of Impairment: Problem solving   Current Attention Level: Sustained            General Comments: followed basic commands most of the time in context:  brought lotion to face but not to mouth today    Extremity/Trunk Assessment               Exercises     Shoulder Instructions       General Comments      Pertinent Vitals/ Pain       Pain Assessment: No/denies pain  Home Living                                          Prior Functioning/Environment  Frequency Min 2X/week     Progress Toward Goals  OT Goals(current goals can now be found in the care plan section)  Progress towards OT goals:  (goals updated)  Acute Rehab OT Goals OT Goal Formulation: Patient unable to participate in goal setting Time For Goal Achievement: 01/05/15 Potential to Achieve Goals: Good ADL Goals Pt Will Perform Grooming: with set-up;sitting (washing hands/face when UEs supported on tray table) Pt Will Perform Upper Body Bathing: with min assist;bed level;sitting Pt Will Transfer to Toilet:  (discontinued) Additional ADL Goal #1: Pt will guard RUE during mobility and lower gently with min cues Additional ADL Goal #2: Husband will be independent with positioning for edema, PROM, and setting pt up to use RUE as a stabilizer  Plan Discharge plan needs to be updated    Co-evaluation                 End of Session     Activity Tolerance Patient tolerated treatment well   Patient Left in bed;with call bell/phone within reach   Nurse Communication          Time: 2035-5974 OT Time Calculation (min): 20 min  Charges: OT General Charges $OT Visit: 1 Procedure OT Treatments $Therapeutic Activity: 8-22 mins  Demarri Elie 12/22/2014, 3:17 PM  Lesle Chris,  OTR/L [phone] 12/22/2014

## 2014-12-22 NOTE — Progress Notes (Signed)
Pt has refused CPAP for the night, RT to monitor and assess as needed.  

## 2014-12-23 DIAGNOSIS — G8191 Hemiplegia, unspecified affecting right dominant side: Secondary | ICD-10-CM

## 2014-12-23 DIAGNOSIS — Z5111 Encounter for antineoplastic chemotherapy: Secondary | ICD-10-CM

## 2014-12-23 LAB — COMPREHENSIVE METABOLIC PANEL
ALT: 20 U/L (ref 0–35)
AST: 27 U/L (ref 0–37)
Albumin: 2.4 g/dL — ABNORMAL LOW (ref 3.5–5.2)
Alkaline Phosphatase: 49 U/L (ref 39–117)
Anion gap: 6 (ref 5–15)
BUN: 28 mg/dL — ABNORMAL HIGH (ref 6–23)
CALCIUM: 8.1 mg/dL — AB (ref 8.4–10.5)
CO2: 34 mmol/L — AB (ref 19–32)
Chloride: 100 mmol/L (ref 96–112)
Creatinine, Ser: 0.94 mg/dL (ref 0.50–1.10)
GFR calc Af Amer: 74 mL/min — ABNORMAL LOW (ref >90)
GFR, EST NON AFRICAN AMERICAN: 64 mL/min — AB (ref >90)
GLUCOSE: 127 mg/dL — AB (ref 70–99)
Potassium: 4.2 mmol/L (ref 3.5–5.1)
SODIUM: 140 mmol/L (ref 135–145)
TOTAL PROTEIN: 5.4 g/dL — AB (ref 6.0–8.3)
Total Bilirubin: 1.1 mg/dL (ref 0.3–1.2)

## 2014-12-23 LAB — URIC ACID: Uric Acid, Serum: 4.1 mg/dL (ref 2.4–7.0)

## 2014-12-23 LAB — CBC
HCT: 46.8 % — ABNORMAL HIGH (ref 36.0–46.0)
HEMOGLOBIN: 15.9 g/dL — AB (ref 12.0–15.0)
MCH: 31.4 pg (ref 26.0–34.0)
MCHC: 34 g/dL (ref 30.0–36.0)
MCV: 92.3 fL (ref 78.0–100.0)
PLATELETS: 87 10*3/uL — AB (ref 150–400)
RBC: 5.07 MIL/uL (ref 3.87–5.11)
RDW: 13.6 % (ref 11.5–15.5)
WBC: 15.4 10*3/uL — ABNORMAL HIGH (ref 4.0–10.5)

## 2014-12-23 LAB — URINALYSIS, ROUTINE W REFLEX MICROSCOPIC
Bilirubin Urine: NEGATIVE
Bilirubin Urine: NEGATIVE
Glucose, UA: NEGATIVE mg/dL
Glucose, UA: NEGATIVE mg/dL
Hgb urine dipstick: NEGATIVE
Hgb urine dipstick: NEGATIVE
KETONES UR: NEGATIVE mg/dL
Ketones, ur: NEGATIVE mg/dL
Leukocytes, UA: NEGATIVE
Nitrite: POSITIVE — AB
Nitrite: POSITIVE — AB
PROTEIN: 30 mg/dL — AB
PROTEIN: 30 mg/dL — AB
SPECIFIC GRAVITY, URINE: 1.013 (ref 1.005–1.030)
Specific Gravity, Urine: 1.012 (ref 1.005–1.030)
UROBILINOGEN UA: 1 mg/dL (ref 0.0–1.0)
Urobilinogen, UA: 1 mg/dL (ref 0.0–1.0)
pH: 7.5 (ref 5.0–8.0)
pH: 7.5 (ref 5.0–8.0)

## 2014-12-23 LAB — URINALYSIS, DIPSTICK ONLY
BILIRUBIN URINE: NEGATIVE
Glucose, UA: NEGATIVE mg/dL
Ketones, ur: NEGATIVE mg/dL
Nitrite: POSITIVE — AB
PH: 7 (ref 5.0–8.0)
Protein, ur: 30 mg/dL — AB
SPECIFIC GRAVITY, URINE: 1.013 (ref 1.005–1.030)
Urobilinogen, UA: 1 mg/dL (ref 0.0–1.0)

## 2014-12-23 LAB — URINE MICROSCOPIC-ADD ON

## 2014-12-23 NOTE — Evaluation (Signed)
Speech Language Pathology Evaluation Patient Details Name: Tanya King MRN: [id number] DOB: 1951-01-30 Today's Date: 12/23/2014 Time: 8756-4332 SLP Time Calculation (min) (ACUTE ONLY): 34 min  Problem List:  Patient Active Problem List   Diagnosis Date Noted  . Lymphoma   . Brain tumor 12/06/2014  . Brain mass 12/07/2014  . Essential hypertension 12/07/2014  . Hyperlipidemia 12/07/2014  . OSA (obstructive sleep apnea) 12/07/2014  . Acute UTI (urinary tract infection)   . Tobacco abuse counseling 07/26/2013  . Asthma   . Hypercholesteremia   . GERD (gastroesophageal reflux disease)   . Sleep apnea   . Edema   . HTN (hypertension)   . Allergic rhinitis   . Vitamin B12 deficiency   . RLS (restless legs syndrome)   . Vitamin deficiency   . COPD (chronic obstructive pulmonary disease)   . Obesity   . Aortic stenosis    Past Medical History:  Past Medical History  Diagnosis Date  . Asthma   . Hypercholesteremia   . GERD (gastroesophageal reflux disease)   . Sleep apnea     AHI 116/hr now on BiPAP at 25/77mmHg  . Edema   . HTN (hypertension)   . Allergic rhinitis   . Vitamin B12 deficiency   . RLS (restless legs syndrome)   . Vitamin deficiency   . Obesity   . Aortic stenosis   . Edema     chronic LE edema  . Heart murmur     (mild AV stenosis with no regurg, mild MV sclerosis with mild MR, mod LVH 2012)  . Carotid artery occlusion     <30% bilateral ICA stenosis, no flow in left vertebral artery  . COPD (chronic obstructive pulmonary disease)     per spouse not diagnosed   Past Surgical History:  Past Surgical History  Procedure Laterality Date  . Tubal ligation    . Craniotomy N/A 12/13/2014    Procedure: LEFT BRAIN BIOPSY w/ Curve;  Surgeon: Newman Pies, MD;  Location: [facility];  Service: Neurosurgery;  Laterality: N/A;  brain biopsy with brain lab   HPI:  Pt is a 64 yo female with recent admit to [facility] with right lower extremity weakness and  slurred speech x2-3 weeks.  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  Pt dc'd home over the weekend and returned for brain biopsy to [facility] on Monday 12/15/2014.  Pt with mild bleed s/p brain bx with resultant dysarthria, increased right sided weakness, left hemianopsia and swallow evaluation ordered.  PMH + for smoking, asthma, HTN.  She has been seen for swalloiwng treatment *during previous admit for language as well.    Assessment / Plan / Recommendation Clinical Impression  Pt presents with moderate cognitive linguistic deficits and dysarthria/motor planning deficits.   Improved expressive language output today noted than over the last few days.  She articulated independently "I love him because he is there, he is always there" referring to her husband!!!  Motor planning difficulties and dysarthria result in inconsistent expressive errors with inconsitent islands of fluent slow speech.  Pt's repetition is intact and this will help maximize her communication rehabiliation.    Pt was oriented to self but not place nor situation during session.   Delayed processing of information noted but pt perseveres as able.  She did not know which button to push on bell to summon nurse, uncertain if not using this as spouse is usually present.    Recommend follow up  SLP to maximize pt's functional cognitive-language abilities.  Educated pt/family to findings, compensation strategies and recommendations.  Requested pt to repeat words that she can't say and family states for her to help access her language center.      SLP Assessment  All further Speech Lanaguage Pathology  needs can be addressed in the next venue of care    Follow Up Recommendations    CIR vs SNF   Frequency and Duration min 2x/week      Pertinent Vitals/Pain Pain Assessment: No/denies pain   SLP Goals  Progression toward goals: Progressing toward goals Patient/Family Stated Goal: none stated Potential to Achieve Goals (ACUTE  ONLY): Good Potential Considerations (ACUTE ONLY): Co-morbidities  SLP Evaluation Prior Functioning  Cognitive/Linguistic Baseline: Baseline deficits Baseline deficit details: deficits from brain mass prior to biopsy, have worsened since surgery Type of Home: House  Lives With: Family Available Help at Discharge: Family;Available 24 hours/day Vocation:  (retired x 2 years)   Cognition  Overall Cognitive Status: Impaired/Different from baseline Orientation Level: Oriented to person;Disoriented to situation;Disoriented to place;Disoriented to time Attention: Sustained Sustained Attention: Appears intact (for basic tasks over a few minutes) Memory: Impaired (stated her last name as [place] not [place]) Memory Impairment: Storage deficit;Retrieval deficit;Decreased recall of new information (pt did not recall how to use call bell for assistance) Awareness: Appears intact (to basic communication deficits and dysphagia) Problem Solving: Impaired (pt did not know button on call bell for assist - uncertain if she has been using it given spouse with her frequently) Executive Function:  (pt not at this level) Behaviors: Perseveration    Comprehension  Auditory Comprehension Overall Auditory Comprehension: Impaired Yes/No Questions: Impaired Basic Biographical Questions: 76-100% accurate Complex Questions: 50-74% accurate Commands: Impaired (intact for 2 step commands) One Step Basic Commands: 75-100% accurate Two Step Basic Commands: 50-74% accurate Conversation: Simple Interfering Components: Processing speed;Motor planning EffectiveTechniques: Extra processing time;Repetition;Pausing;Slowed speech;Visual/Gestural cues Visual Recognition/Discrimination Discrimination: Not tested Reading Comprehension Reading Status: Impaired (reading basic sentences - 1 of 2 correct ) Word level: Impaired Sentence Level: Impaired Paragraph Level: Not tested Functional Environmental (signs,  name badge): Not tested Interfering Components: Processing time;Working Engineer, technical sales: Visual cueing;Verbal cueing    Expression Expression Primary Mode of Expression: Verbal Verbal Expression Overall Verbal Expression: Impaired Level of Generative/Spontaneous Verbalization: Sentence Repetition: No impairment Naming: Impairment Responsive: Not tested Confrontation: Impaired (2/4 items named correctly - perseveration noted and neologism) Verbal Errors: Neologisms;Perseveration;Not aware of errors Pragmatics: No impairment Effective Techniques: Phonemic cues;Articulatory cues (repetition helpful) Non-Verbal Means of Communication: Not applicable Written Expression Dominant Hand: Right Written Expression: Not tested (pt with right hemiparesis)   Oral / Motor Oral Motor/Sensory Function Overall Oral Motor/Sensory Function: Impaired Labial ROM: Reduced right Labial Symmetry: Abnormal symmetry right Labial Strength: Reduced Labial Sensation: Reduced Lingual ROM: Reduced right Lingual Symmetry: Within Functional Limits Lingual Strength: Reduced Facial ROM: Reduced right Facial Symmetry: Right droop Facial Strength: Reduced Facial Sensation: Reduced Velum: Impaired left;Impaired right (sluggish when elicited with phonation) Motor Speech Overall Motor Speech: Impaired Respiration: Impaired Level of Impairment: Phrase Phonation: Low vocal intensity;Breathy Resonance: Within functional limits Articulation: Impaired Level of Impairment: Word Intelligibility: Intelligibility reduced Word: 50-74% accurate Phrase: 25-49% accurate Sentence: 25-49% accurate Conversation: Not tested Motor Planning: Impaired Level of Impairment: Word Motor Speech Errors: Groping for words;Inconsistent Effective Techniques: Slow rate   [place]offe[place], [facility] SLP [phone]

## 2014-12-23 NOTE — Progress Notes (Signed)
Pt refusing CPAP for the night, RT to monitor and assess as needed.

## 2014-12-23 NOTE — Progress Notes (Signed)
Tanya King has tolerated treatment okay. She is on infusional methotrexate right now.  She is involved with physical therapy, occupational therapy, and speech therapy. She seems to be eating okay. She's having a pured diet.  There still is not much movement with the right arm.  She's not had any obvious nausea or vomiting. She's had no obvious diarrhea.  She's urinating quite a bit.  She's had no mouth sores. There's been no headache. She's had no seizures.  On her physical exam, her vital signs are stable. Blood pressure is 142/60. Temperature 97.9. Pulse is 66. Her oral exam shows no mucositis. There is no adenopathy on her neck. She has the biopsy scar on the left frontal region of her scalp well-healed. Lungs are clear. Cardiac exam regular rate and rhythm. There are no murmurs, rubs or bruits. Abdomen is soft. She has decent bowel sounds. There is no guarding or rebound tenderness. Extremities shows some chronic, trace edema. She has no movement in the right arm. There is some slight movement in the right leg. Skin exam shows no rashes.  On her labs, her white count 15.4. Hemoglobin 15.9. Blood count 87,000. Her BUN is 20 and creatinine 0.94. blood sugar is 127.  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 She is on systemic chemotherapy.  She will finish up the methotrexate tomorrow. She'll then start leucovorin rescue. We'll check a methotrexate level on Monday.  Hopefully, she will be able to go over to inpatient rehabilitation and try to get stronger.  The staff on [address] are doing a fantastic job with her.  Lum Keas

## 2014-12-24 DIAGNOSIS — K5900 Constipation, unspecified: Secondary | ICD-10-CM

## 2014-12-24 LAB — URINALYSIS, DIPSTICK ONLY
BILIRUBIN URINE: NEGATIVE
BILIRUBIN URINE: NEGATIVE
Bilirubin Urine: NEGATIVE
GLUCOSE, UA: NEGATIVE mg/dL
Glucose, UA: NEGATIVE mg/dL
Glucose, UA: NEGATIVE mg/dL
HGB URINE DIPSTICK: NEGATIVE
Hgb urine dipstick: NEGATIVE
Hgb urine dipstick: NEGATIVE
KETONES UR: NEGATIVE mg/dL
KETONES UR: NEGATIVE mg/dL
Ketones, ur: NEGATIVE mg/dL
Leukocytes, UA: NEGATIVE
Leukocytes, UA: NEGATIVE
Nitrite: NEGATIVE
Nitrite: NEGATIVE
Nitrite: NEGATIVE
PROTEIN: NEGATIVE mg/dL
Protein, ur: NEGATIVE mg/dL
Protein, ur: NEGATIVE mg/dL
SPECIFIC GRAVITY, URINE: 1.013 (ref 1.005–1.030)
SPECIFIC GRAVITY, URINE: 1.012 (ref 1.005–1.030)
Specific Gravity, Urine: 1.013 (ref 1.005–1.030)
UROBILINOGEN UA: 1 mg/dL (ref 0.0–1.0)
Urobilinogen, UA: 1 mg/dL (ref 0.0–1.0)
Urobilinogen, UA: 1 mg/dL (ref 0.0–1.0)
pH: 7.5 (ref 5.0–8.0)
pH: 7.5 (ref 5.0–8.0)
pH: 8 (ref 5.0–8.0)

## 2014-12-24 LAB — COMPREHENSIVE METABOLIC PANEL
ALT: 28 U/L (ref 0–35)
AST: 27 U/L (ref 0–37)
Albumin: 2.4 g/dL — ABNORMAL LOW (ref 3.5–5.2)
Alkaline Phosphatase: 47 U/L (ref 39–117)
Anion gap: 5 (ref 5–15)
BUN: 31 mg/dL — ABNORMAL HIGH (ref 6–23)
CALCIUM: 8 mg/dL — AB (ref 8.4–10.5)
CO2: 35 mmol/L — AB (ref 19–32)
CREATININE: 0.97 mg/dL (ref 0.50–1.10)
Chloride: 98 mmol/L (ref 96–112)
GFR calc Af Amer: 72 mL/min — ABNORMAL LOW (ref >90)
GFR calc non Af Amer: 62 mL/min — ABNORMAL LOW (ref >90)
GLUCOSE: 118 mg/dL — AB (ref 70–99)
Potassium: 4.2 mmol/L (ref 3.5–5.1)
SODIUM: 138 mmol/L (ref 135–145)
Total Bilirubin: 0.8 mg/dL (ref 0.3–1.2)
Total Protein: 5.3 g/dL — ABNORMAL LOW (ref 6.0–8.3)

## 2014-12-24 LAB — METHOTREXATE: METHOTREXATE: 14.52

## 2014-12-24 MED ORDER — LEVETIRACETAM 500 MG PO TABS
500.0000 mg | ORAL_TABLET | Freq: Two times a day (BID) | ORAL | Status: DC
  Administered 2014-12-24 – 2015-01-01 (×18): 500 mg via ORAL
  Filled 2014-12-24 (×20): qty 1

## 2014-12-24 MED ORDER — LACTULOSE 10 GM/15ML PO SOLN
20.0000 g | Freq: Three times a day (TID) | ORAL | Status: DC
  Administered 2014-12-24 – 2014-12-25 (×5): 20 g via ORAL
  Filled 2014-12-24 (×4): qty 30

## 2014-12-24 MED ORDER — FOSFOMYCIN TROMETHAMINE 3 G PO PACK
3.0000 g | PACK | Freq: Once | ORAL | Status: AC
  Administered 2014-12-24: 3 g via ORAL
  Filled 2014-12-24: qty 3

## 2014-12-24 MED ORDER — SODIUM CHLORIDE 0.9 % IJ SOLN
INTRAMUSCULAR | Status: AC
  Administered 2014-12-24: 10 mL
  Filled 2014-12-24: qty 10

## 2014-12-24 MED ORDER — LEUCOVORIN CALCIUM INJECTION 350 MG
100.0000 mg/m2 | INTRAMUSCULAR | Status: AC
Start: 2014-12-24 — End: 2014-12-25
  Administered 2014-12-24 – 2014-12-25 (×6): 238 mg via INTRAVENOUS
  Filled 2014-12-24 (×8): qty 11.9

## 2014-12-24 MED ORDER — SODIUM CHLORIDE 0.9 % IJ SOLN
INTRAMUSCULAR | Status: AC
  Filled 2014-12-24: qty 10

## 2014-12-24 NOTE — Progress Notes (Signed)
Patient's methotrexate level was 14.52,called results to Dr.Ennever,no new order.

## 2014-12-24 NOTE — Progress Notes (Signed)
The urine culture from 12/22/14 is growing E.coli(pan-sensitive) and enterococcus species(no sensitivity data). I notified Dr. Marin Olp and he gave a verbal order for fosfomycin 3g PO x 1.   Romeo Rabon, PharmD, pager [phone]. 12/24/2014,3:46 PM.

## 2014-12-24 NOTE — Progress Notes (Signed)
Tanya King is now done with chemotherapy. She tolerated the methotrexate pretty well. So far, she has not had any, side effects.  She is now on leucovorin rescue. We will check a methotrexate level on her tomorrow.  She has not had a bowel movement in a couple days. I will try some lactulose on her.  The still not much movement in the right side.  Her labs look pretty good.  She sees me eating okay. She's not having a lot of swallowing difficulties.  I appreciate everybody working on her for her therapy.  We probably will have to see about inpatient rehabilitation on her.  On her physical exam, her vital signs are stable. Blood pressure is 135/50. Temperature 98.6. Pulse 55. Oral cavity shows no mucositis. Lungs are clear. Cardiac exam regular in rhythm. Abdomen soft. Shows good bowel sounds. There is no guarding or rebound tenderness. Extremities shows no movement on the right side. She may have some slight movement in the right foot. Skin exam shows no rashes.  Tanya King completed cycle 1 of chemotherapy for primary CNS lymphoma.  We now have to check methotrexate levels. I suspect that it probably will be 3 or 4 days before she gets her methotrexate levels down.  We will have to work on getting her over to inpatient rehabilitation next week. Hopefully, they will be able to take her.  The staff on [facility] has done an outstanding job in taking care of her.!!!  Laurey Arrow

## 2014-12-24 NOTE — Progress Notes (Signed)
Pt has refused CPAP for the night, CPAP removed from Pt's room.  RT to monitor and assess as needed.

## 2014-12-24 NOTE — Progress Notes (Signed)
Methotrexate = 14.52, drawn ~40 hours after the start of the methotrexate infusion. Per leucovorin rescue nomogram this calls for increasing leucovorin doses from the current 15mg /m2 IV q3h to 100mg /m2 IV q3h. I called and discussed with Dr. Marin Olp and he approved of the dose increase.  Next methotrexate level scheduled for 3/6 am.  Romeo Rabon, PharmD, pager [phone]. 12/24/2014,6:14 PM.

## 2014-12-25 DIAGNOSIS — N390 Urinary tract infection, site not specified: Secondary | ICD-10-CM

## 2014-12-25 LAB — URINALYSIS, DIPSTICK ONLY
Bilirubin Urine: NEGATIVE
Glucose, UA: NEGATIVE mg/dL
HGB URINE DIPSTICK: NEGATIVE
Ketones, ur: NEGATIVE mg/dL
Leukocytes, UA: NEGATIVE
NITRITE: NEGATIVE
Protein, ur: NEGATIVE mg/dL
Specific Gravity, Urine: 1.014 (ref 1.005–1.030)
UROBILINOGEN UA: 1 mg/dL (ref 0.0–1.0)
pH: 8 (ref 5.0–8.0)

## 2014-12-25 LAB — URINALYSIS, ROUTINE W REFLEX MICROSCOPIC
Bilirubin Urine: NEGATIVE
Bilirubin Urine: NEGATIVE
Glucose, UA: NEGATIVE mg/dL
Glucose, UA: NEGATIVE mg/dL
HGB URINE DIPSTICK: NEGATIVE
Hgb urine dipstick: NEGATIVE
KETONES UR: NEGATIVE mg/dL
Ketones, ur: NEGATIVE mg/dL
Nitrite: NEGATIVE
Nitrite: POSITIVE — AB
Protein, ur: NEGATIVE mg/dL
Protein, ur: NEGATIVE mg/dL
SPECIFIC GRAVITY, URINE: 1.014 (ref 1.005–1.030)
Specific Gravity, Urine: 1.014 (ref 1.005–1.030)
Urobilinogen, UA: 1 mg/dL (ref 0.0–1.0)
Urobilinogen, UA: 1 mg/dL (ref 0.0–1.0)
pH: 7.5 (ref 5.0–8.0)
pH: 8 (ref 5.0–8.0)

## 2014-12-25 LAB — URINE CULTURE
Colony Count: >=100000
SPECIAL REQUESTS: NORMAL

## 2014-12-25 LAB — URINE MICROSCOPIC-ADD ON

## 2014-12-25 LAB — COMPREHENSIVE METABOLIC PANEL
ALBUMIN: 2.4 g/dL — AB (ref 3.5–5.2)
ALT: 43 U/L — ABNORMAL HIGH (ref 0–35)
ANION GAP: 7 (ref 5–15)
AST: 30 U/L (ref 0–37)
Alkaline Phosphatase: 43 U/L (ref 39–117)
BUN: 31 mg/dL — AB (ref 6–23)
CALCIUM: 8.2 mg/dL — AB (ref 8.4–10.5)
CO2: 36 mmol/L — AB (ref 19–32)
Chloride: 98 mmol/L (ref 96–112)
Creatinine, Ser: 0.9 mg/dL (ref 0.50–1.10)
GFR, EST AFRICAN AMERICAN: 78 mL/min — AB (ref >90)
GFR, EST NON AFRICAN AMERICAN: 68 mL/min — AB (ref >90)
GLUCOSE: 129 mg/dL — AB (ref 70–99)
Potassium: 4.2 mmol/L (ref 3.5–5.1)
Sodium: 141 mmol/L (ref 135–145)
Total Bilirubin: 0.6 mg/dL (ref 0.3–1.2)
Total Protein: 4.9 g/dL — ABNORMAL LOW (ref 6.0–8.3)

## 2014-12-25 LAB — METHOTREXATE
METHOTREXATE: 1.25
Methotrexate: 2.18

## 2014-12-25 LAB — CBC
HCT: 45.1 % (ref 36.0–46.0)
Hemoglobin: 14.8 g/dL (ref 12.0–15.0)
MCH: 30.9 pg (ref 26.0–34.0)
MCHC: 32.8 g/dL (ref 30.0–36.0)
MCV: 94.2 fL (ref 78.0–100.0)
PLATELETS: 88 10*3/uL — AB (ref 150–400)
RBC: 4.79 MIL/uL (ref 3.87–5.11)
RDW: 13.9 % (ref 11.5–15.5)
WBC: 11 10*3/uL — ABNORMAL HIGH (ref 4.0–10.5)

## 2014-12-25 MED ORDER — LACTULOSE 10 GM/15ML PO SOLN
30.0000 g | ORAL | Status: DC
  Administered 2014-12-25 – 2014-12-26 (×3): 30 g via ORAL
  Filled 2014-12-25 (×10): qty 45

## 2014-12-25 MED ORDER — LEUCOVORIN CALCIUM INJECTION 100 MG
10.0000 mg/m2 | INTRAMUSCULAR | Status: DC
  Administered 2014-12-25 – 2014-12-26 (×8): 24 mg via INTRAVENOUS
  Filled 2014-12-25 (×10): qty 1.2

## 2014-12-25 NOTE — Progress Notes (Signed)
Patient's husband concerned about wife not having BM since 2/27,pt. was on Lactulose 20 g three times a day. Called the problem to Dr. Marin Olp and change orderto Lactulose 30 g every 4 hours, endorsed to night nurse Kennyth Lose.

## 2014-12-25 NOTE — Progress Notes (Signed)
An additional MTX level = 1.25, drawn ~63 hours after the start of the MTX infusion. Per leucovorin rescue nomogram, continue leucovorin to 10mg /m2 IV q3h.  Romeo Rabon, PharmD, pager [phone]. 12/25/2014,4:09 PM.

## 2014-12-25 NOTE — Progress Notes (Signed)
Tanya King is doing okay. She is still not having any issues with chemotherapy. Pharmacy is doing a good job in monitoring the methotrexate levels. She is on leucovorin. She had to have the leucovorin dose increased.  Her methotrexate level today is 2.18.   There's been no change in her physical state. There is still no strength in her right arm. There is a in her right leg.  She's eating okay. There is no mouth sores. There is no nausea or vomiting.  She does have a urinary tract infection. Pharmacy, again, been top-notch, went ahead and put her on Monurol.  Overall her vital signs are pretty stable. Blood pressure 129/50. Temperature 97.9. Pulse is 56. Oral exam shows no mucositis. Lungs are clear. Cardiac exam regular rate and rhythm with no murmurs, rubs or bruits. Abdomen is soft. Extremities shows no change in strength on the right side. Neurological exam is nonfocal.  Her labs are stable. BUN 31 creatinine 0.9. White cell count 11. Hemoglobin 14.8. Platelet count 88.  Again, her methotrexate level is where I expected to be. Thankfully, pharmacy is helping [place] manage the leucovorin.  Once her methotrexate level gets below toxic, then we can see about getting her over to inpatient rehabilitation if she is a candidate.  Lum Keas

## 2014-12-25 NOTE — Plan of Care (Signed)
Problem: Phase I Progression Outcomes Goal: Voiding-avoid urinary catheter unless indicated Outcome: Not Applicable Date Met:  72/90/21 Patient has a foley catheter.

## 2014-12-25 NOTE — Progress Notes (Signed)
The second MTX level = 2.18, drawn ~58 hours after the start of the MTX infusion. Per leucovorin rescue nomogram, will reduce leucovorin to 10mg /m2 IV q3h.  SCr remains wnl. Bicarb infusion continues. UOP good(0.67ml/kg/hr) and urine pH ranging 7.5-8.  Romeo Rabon, PharmD, pager [phone]. 12/25/2014,9:55 AM.

## 2014-12-25 NOTE — Progress Notes (Signed)
Pt refused cpap for the night, Rt informed pt if she changed her mind to call for RT

## 2014-12-26 LAB — URINALYSIS, ROUTINE W REFLEX MICROSCOPIC
BILIRUBIN URINE: NEGATIVE
BILIRUBIN URINE: NEGATIVE
GLUCOSE, UA: NEGATIVE mg/dL
Glucose, UA: NEGATIVE mg/dL
Hgb urine dipstick: NEGATIVE
KETONES UR: NEGATIVE mg/dL
Ketones, ur: NEGATIVE mg/dL
Nitrite: NEGATIVE
Nitrite: POSITIVE — AB
PH: 7.5 (ref 5.0–8.0)
PH: 8 (ref 5.0–8.0)
PROTEIN: NEGATIVE mg/dL
Protein, ur: NEGATIVE mg/dL
SPECIFIC GRAVITY, URINE: 1.017 (ref 1.005–1.030)
Specific Gravity, Urine: 1.017 (ref 1.005–1.030)
Urobilinogen, UA: 2 mg/dL — ABNORMAL HIGH (ref 0.0–1.0)
Urobilinogen, UA: 1 mg/dL (ref 0.0–1.0)

## 2014-12-26 LAB — URINALYSIS, DIPSTICK ONLY
BILIRUBIN URINE: NEGATIVE
Glucose, UA: NEGATIVE mg/dL
KETONES UR: NEGATIVE mg/dL
LEUKOCYTES UA: NEGATIVE
NITRITE: POSITIVE — AB
PH: 8.5 — AB (ref 5.0–8.0)
Protein, ur: NEGATIVE mg/dL
Specific Gravity, Urine: 1.015 (ref 1.005–1.030)
UROBILINOGEN UA: 1 mg/dL (ref 0.0–1.0)

## 2014-12-26 LAB — COMPREHENSIVE METABOLIC PANEL
ALK PHOS: 52 U/L (ref 39–117)
ALT: 61 U/L — AB (ref 0–35)
AST: 34 U/L (ref 0–37)
Albumin: 2.5 g/dL — ABNORMAL LOW (ref 3.5–5.2)
Anion gap: 7 (ref 5–15)
BUN: 30 mg/dL — ABNORMAL HIGH (ref 6–23)
CALCIUM: 8.5 mg/dL (ref 8.4–10.5)
CO2: 35 mmol/L — AB (ref 19–32)
Chloride: 101 mmol/L (ref 96–112)
Creatinine, Ser: 0.74 mg/dL (ref 0.50–1.10)
GFR calc non Af Amer: 90 mL/min — ABNORMAL LOW (ref >90)
GLUCOSE: 130 mg/dL — AB (ref 70–99)
POTASSIUM: 4 mmol/L (ref 3.5–5.1)
SODIUM: 143 mmol/L (ref 135–145)
Total Bilirubin: 0.8 mg/dL (ref 0.3–1.2)
Total Protein: 5.4 g/dL — ABNORMAL LOW (ref 6.0–8.3)

## 2014-12-26 LAB — CBC
HCT: 47.5 % — ABNORMAL HIGH (ref 36.0–46.0)
Hemoglobin: 15.6 g/dL — ABNORMAL HIGH (ref 12.0–15.0)
MCH: 31.2 pg (ref 26.0–34.0)
MCHC: 32.8 g/dL (ref 30.0–36.0)
MCV: 95 fL (ref 78.0–100.0)
Platelets: 92 10*3/uL — ABNORMAL LOW (ref 150–400)
RBC: 5 MIL/uL (ref 3.87–5.11)
RDW: 14 % (ref 11.5–15.5)
WBC: 10.6 10*3/uL — ABNORMAL HIGH (ref 4.0–10.5)

## 2014-12-26 LAB — URINE MICROSCOPIC-ADD ON

## 2014-12-26 LAB — METHOTREXATE: Methotrexate: 0.7

## 2014-12-26 MED ORDER — DEXAMETHASONE 4 MG PO TABS
4.0000 mg | ORAL_TABLET | Freq: Three times a day (TID) | ORAL | Status: DC
  Administered 2014-12-26 – 2014-12-27 (×3): 4 mg via ORAL
  Filled 2014-12-26 (×6): qty 1

## 2014-12-26 MED ORDER — LEUCOVORIN CALCIUM INJECTION 100 MG
10.0000 mg/m2 | Freq: Four times a day (QID) | INTRAMUSCULAR | Status: DC
  Administered 2014-12-26 – 2015-01-02 (×26): 24 mg via INTRAVENOUS
  Filled 2014-12-26 (×33): qty 1.2

## 2014-12-26 MED ORDER — LACTULOSE 10 GM/15ML PO SOLN
30.0000 g | Freq: Three times a day (TID) | ORAL | Status: DC
Start: 2014-12-26 — End: 2015-01-02
  Administered 2014-12-26 – 2014-12-31 (×15): 30 g via ORAL
  Administered 2015-01-01: 20 g via ORAL
  Administered 2015-01-01: 30 g via ORAL
  Filled 2014-12-26 (×24): qty 45

## 2014-12-26 NOTE — Progress Notes (Signed)
INITIAL NUTRITION ASSESSMENT  DOCUMENTATION CODES Per approved criteria  -Morbid Obesity   INTERVENTION: Encourage PO intake RD to continue to monitor PO intake  NUTRITION DIAGNOSIS: Inadequate oral intake related to poor appetite as evidenced by PO intake <50%.   Goal: Pt to meet >/= 90% of their estimated nutrition needs   Monitor:  PO and supplemental intake, weight, labs, I/O's  Reason for Assessment: Low Braden score of 12 or less  Admitting RJ:JOACZYSAYT balancing and right-sided weakness  ASSESSMENT: 64 y.o. female with known history of hypertension, hyperlipidemia, probable COPD, tobacco abuse presents to the ER because of increasing difficulty walking due to balance issues and also patient's family found the patient has been having some weakness on the right and lower extremities. Patient also was found to have some slurred speech. Patient's symptoms have been ongoing for last 2-3 weeks.  Pt reports eating well. PO intake documented: 20-75%.  Pt's weight is stable.  Pt reports not drinking nutritional supplements PTA. Pt declines supplements despite poor PO at this time.  Nutrition focused physical exam shows no sign of depletion of muscle mass or body fat.  Labs reviewed: Elevated BUN  Height: Ht Readings from Last 1 Encounters:  11/28/2014 5\' 6"  (1.676 m)    Weight: Wt Readings from Last 1 Encounters:  11/26/2014 266 lb 8.6 oz (120.901 kg)    Ideal Body Weight: 130 lb  % Ideal Body Weight: 205%  Wt Readings from Last 10 Encounters:  12/18/2014 266 lb 8.6 oz (120.901 kg)  12/07/14 266 lb 8.6 oz (120.9 kg)  09/12/14 276 lb 12.8 oz (125.556 kg)  07/26/13 269 lb (122.018 kg)    Usual Body Weight: 270 lb  % Usual Body Weight: 99%  BMI:  Body mass index is 43.04 kg/(m^2).  Estimated Nutritional Needs: Kcal: 1800-2000 Protein: 85-95g Fluid: 1.8L/day  Skin: closed incision on back and head  Diet Order: Diet - low sodium heart healthy DIET DYS  3  EDUCATION NEEDS: -No education needs identified at this time   Intake/Output Summary (Last 24 hours) at 12/26/14 0945 Last data filed at 12/26/14 0526  Gross per 24 hour  Intake   3049 ml  Output   2500 ml  Net    549 ml    Last BM: 3/7 -per MD note  Labs:   Recent Labs Lab 12/24/14 0605 12/25/14 0515 12/26/14 0815  NA 138 141 143  K 4.2 4.2 4.0  CL 98 98 101  CO2 35* 36* 35*  BUN 31* 31* 30*  CREATININE 0.97 0.90 0.74  CALCIUM 8.0* 8.2* 8.5  GLUCOSE 118* 129* 130*    CBG (last 3)  No results for input(s): GLUCAP in the last 72 hours.  Scheduled Meds: . antiseptic oral rinse  7 mL Mouth Rinse BID  . dexamethasone  4 mg Oral 3 times per day  . docusate sodium  100 mg Oral BID  . enoxaparin (LOVENOX) injection  40 mg Subcutaneous Daily  . famciclovir  500 mg Oral Daily  . fluconazole  100 mg Oral Daily  . losartan  100 mg Oral Daily   And  . hydrochlorothiazide  12.5 mg Oral Daily  . lactulose  30 g Oral TID  . leucovorin  10 mg/m2 Intravenous Q3H  . levETIRAcetam  500 mg Oral BID  . mometasone-formoterol  2 puff Inhalation BID  . sodium bicarbonate/sodium chloride   Mouth Rinse Q6H    Continuous Infusions: . sodium chloride 10 mL/hr at 12/20/14 1029  .  sodium bicarbonate infusion 1/4 NS 1000 mL 125 mL/hr at 12/26/14 0355    Past Medical History  Diagnosis Date  . Asthma   . Hypercholesteremia   . GERD (gastroesophageal reflux disease)   . Sleep apnea     AHI 116/hr now on BiPAP at 25/81mmHg  . Edema   . HTN (hypertension)   . Allergic rhinitis   . Vitamin B12 deficiency   . RLS (restless legs syndrome)   . Vitamin deficiency   . Obesity   . Aortic stenosis   . Edema     chronic LE edema  . Heart murmur     (mild AV stenosis with no regurg, mild MV sclerosis with mild MR, mod LVH 2012)  . Carotid artery occlusion     <30% bilateral ICA stenosis, no flow in left vertebral artery  . COPD (chronic obstructive pulmonary disease)     per  spouse not diagnosed    Past Surgical History  Procedure Laterality Date  . Tubal ligation    . Craniotomy N/A 12/09/2014    Procedure: LEFT BRAIN BIOPSY w/ Curve;  Surgeon: Newman Pies, MD;  Location: [facility];  Service: Neurosurgery;  Laterality: N/A;  brain biopsy with brain lab    Clayton Bibles, MS, RD, LDN Pager: [phone] After Hours Pager: [phone]

## 2014-12-26 NOTE — Progress Notes (Signed)
She is doing okay. She had couple bowel movements last night. I will cut back a on the lactulose.  She has had no nausea or vomiting. There's been no mouth sores.  She has a good methotrexate level. It is coming down as expected.  There's been a good urine output. Her urine is still alkaline.  Her appetite, hopefully, will improve a today now that she has had a bowel movement.  She seems pretty alert. Her speech is about the same. She still is on the dysphagia 3 diet.  There's been no fever. She's had no rashes. She's had no shortness of breath.  On her physical exam, all of her vital signs are stable. Her blood pressure 158/55. Her pulse is 53. Temperature 97.8. Her lungs are clear bilaterally. Oral exam shows no mucositis. Cardiac exam regular rate and rhythm. Abdomen is soft. She does not seem to be as distended. Extremities shows no clubbing, cyanosis or edema. There is still no movement in the right arm area and there is very little movement in the right leg.  There is no labs today. I will check some tomorrow.  I appreciate pharmacy's help in monitoring her methotrexate level. They're doing a great job in dosing the leucovorin.  Once her methotrexate level gets below toxic, then we will see about getting her over to inpatient rehabilitation. We do not have to do next round of chemotherapy for 3 weeks. Hopefully if she can go to inpatient rehabilitation, they can work hard to try to get strength on the right side.  The staff on [address] has done a fantastic job. Her husband is very pleased with all the great care that she is getting!!  Psalm 27:1

## 2014-12-26 NOTE — Progress Notes (Signed)
Today's MTX level = 0.70, drawn ~87 hours after the start of the MTX infusion. Per leucovorin rescue nomogram, may decrease leucovorin to 10mg /m2 IV q6h.  Peggyann Juba, PharmD, BCPS Pager: [phone]  12/26/2014,11:54 AM.

## 2014-12-26 NOTE — Progress Notes (Signed)
Physical Therapy Treatment Patient Details Name: Tanya King MRN: [id number] DOB: 04-06-1951 Today's Date: 12/26/2014    History of Present Illness This 64 y.o. femlae admitted 12/17/2014 with AMS, Rt hemiparesis, dyarthria and apraxia.  MRI showed multiple intracranial lesions.  Metastatic worku p done thus far has been negative.  She underwent stereotatic brain biopsy 11/25/2014 with pathology results pending.  PMH includes: aortic stenosis; asthma; HTN    PT Comments    Pt in bed awake but sleepy/groggy.  Assisted from supine to EOB to perform dynamic sitting TE's however pt was only able to tolerate time of 5 min.  Difficulty staying on task and required hand over hand assist to perform 75% of time.  BP taken 80/60 so returned pt to supine position + 2 total assist when noted BM.  Assisted NT with side to side rolling and hygiene plus repositioning pt to comfort. Pt progressing poorly.  Requires HOYER Lift for tranfers.  Pt is not getting OOB with nursing.   Follow Up Recommendations  CIR;SNF     Equipment Recommendations  Wheelchair (measurements PT);Wheelchair cushion (measurements PT)    Recommendations for Other Services       Precautions / Restrictions Precautions Precautions: Fall Precaution Comments: dense R hemiplegia Restrictions Weight Bearing Restrictions: No    Mobility  Bed Mobility Overal bed mobility: Needs Assistance Bed Mobility: Supine to Sit;Sit to Supine Rolling: Total assist;+2 for physical assistance   Supine to sit: +2 for physical assistance;+2 for safety/equipment;Total assist Sit to supine: +2 for physical assistance;+2 for safety/equipment;Total assist   General bed mobility comments: + 2 total assist to transfer from supine to EOB pt 5%.  pt required increased time and repeat cueing to stay on task.  Pt was only able to tolerate shor sitting activity of 5 min due to increased appearance of groggyness and increased c/o fatigue.  Eyes mostly closed  and drifting off requiring increased stimulation and cueing to stay on task.  attempted to perform dynamic cross midline reaching using cups with hand over hand L UE and assist to facititate trunk in most upright position.  LOB all planes esp R/posterior.  BP taken EOB 80/60.  Returned pt to supine + 2 total assist pt 0%.   Performed side to side rolling to perform self hygiene due to BM.  Increased time to position to comfort.    Transfers                 General transfer comment: unable to attempt OOB activity due to low performance sitting EOB and freq episodes of loose stools.  Ambulation/Gait         Gait velocity: pt non amb at this time.        Stairs            Wheelchair Mobility    Modified Rankin (Stroke Patients Only)       Balance                                    Cognition Arousal/Alertness: Awake/alert Behavior During Therapy: Flat affect Overall Cognitive Status: Impaired/Different from baseline Area of Impairment: Problem solving   Current Attention Level: Sustained Memory: Decreased short-term memory   Safety/Judgement: Decreased awareness of safety     General Comments: followed basic commands most of the time in context:  brought lotion to face    Exercises  General Comments        Pertinent Vitals/Pain Pain Assessment: No/denies pain    Home Living                      Prior Function            PT Goals (current goals can now be found in the care plan section)      Frequency  Min 3X/week    PT Plan Current plan remains appropriate    Co-evaluation             End of Session Equipment Utilized During Treatment: Oxygen Activity Tolerance: Patient limited by fatigue;Treatment limited secondary to medical complications (Comment) Patient left: in bed;with call bell/phone within reach     Time: 1020-1054 PT Time Calculation (min) (ACUTE ONLY): 34 min  Charges:  $Therapeutic  Activity: 23-37 mins                    G Codes:      Rica Koyanagi  PTA [facility]  Acute  Rehab Pager      [phone]

## 2014-12-26 NOTE — Progress Notes (Signed)
Patient continues to decline the use of nocturnal CPAP. Family at bedside states she seems to be resting well without CPAP as long as she is up in the bed and wearing oxygen. Patient and family are aware that RT is available at any time if she should require further assistance.

## 2014-12-26 NOTE — Progress Notes (Signed)
Occupational Therapy Treatment Patient Details Name: Tanya King MRN: [id number] DOB: 16-Feb-1951 Today's Date: 12/26/2014    History of present illness This 64 y.o. femlae admitted 12/16/2014 with AMS, Rt hemiparesis, dyarthria and apraxia.  MRI showed multiple intracranial lesions.  Metastatic worku p done thus far has been negative.  She underwent stereotatic brain biopsy 12/11/2014 with pathology results pending.  PMH includes: aortic stenosis; asthma; HTN   OT comments  No family present. Did share with RN increased edema LUE.             Precautions / Restrictions Precautions Precautions: Fall Precaution Comments: dense R hemiplegia Restrictions Weight Bearing Restrictions: No       Mobility Bed Mobility   General bed mobility comments: did not perform this day  Transfers                 General transfer comment: did not perform this day        ADL       Grooming: Oral care;Bed level;Set up (cueing for completeness)                                 General ADL Comments: Pts LUe with increased edema.  OT did communicate with RN.  OT positioned RUE and LUE on pillows to decrease edema.         Extremity/Trunk Assessment               Exercises General Exercises - Upper Extremity Shoulder Flexion: PROM;Right;10 reps Elbow Flexion: PROM;Right;10 reps Elbow Extension: PROM;10 reps;Right Wrist Flexion: PROM;Right;10 reps Wrist Extension: PROM;10 reps;Right Digit Composite Flexion: PROM;Right;10 reps   Shoulder Instructions       General Comments      Pertinent Vitals/ Pain       Pain Assessment: No/denies pain  Home Living                                          Prior Functioning/Environment                    Plan         End of Session Pt left in bed with call bell with in  reach  Activity Tolerance     Patient Left             Time: 1305-1330 OT Time Calculation (min): 25  min  Charges: OT General Charges $OT Visit: 1 Procedure OT Treatments $Self Care/Home Management : 8-22 mins $Therapeutic Exercise: 8-22 mins  Derral Colucci D 12/26/2014, 1:49 PM

## 2014-12-27 LAB — URINALYSIS, ROUTINE W REFLEX MICROSCOPIC
BILIRUBIN URINE: NEGATIVE
Glucose, UA: NEGATIVE mg/dL
HGB URINE DIPSTICK: NEGATIVE
Ketones, ur: NEGATIVE mg/dL
Leukocytes, UA: NEGATIVE
Nitrite: POSITIVE — AB
Protein, ur: NEGATIVE mg/dL
SPECIFIC GRAVITY, URINE: 1.017 (ref 1.005–1.030)
Urobilinogen, UA: 1 mg/dL (ref 0.0–1.0)
pH: 8 (ref 5.0–8.0)

## 2014-12-27 LAB — URINE MICROSCOPIC-ADD ON

## 2014-12-27 MED ORDER — DEXAMETHASONE 4 MG PO TABS
4.0000 mg | ORAL_TABLET | Freq: Two times a day (BID) | ORAL | Status: DC
  Administered 2014-12-27 – 2014-12-29 (×5): 4 mg via ORAL
  Filled 2014-12-27 (×7): qty 1

## 2014-12-27 NOTE — Progress Notes (Addendum)
Clinical Social Work Department CLINICAL SOCIAL WORK PLACEMENT NOTE 12/27/2014  Patient:  Tanya King, Tanya King  Account Number:  [id number] Admit date:  12/01/2014  Clinical Social Worker:  Maryln Manuel  Date/time:  12/27/2014 03:00 PM  Clinical Social Work is seeking post-discharge placement for this patient at the following level of care:   [facility]   (*CSW will update this form in Epic as items are completed)   12/27/2014  Patient/family provided with [place] Department of Clinical Social Work's list of facilities offering this level of care within the geographic area requested by the patient (or if unable, by the patient's family).  12/27/2014  Patient/family informed of their freedom to choose among providers that offer the needed level of care, that participate in Medicare, Medicaid or managed care program needed by the patient, have an available bed and are willing to accept the patient.  12/27/2014  Patient/family informed of [facility]' ownership interest in [facility], as well as of the fact that they are under no obligation to receive care at this facility.  PASARR submitted to EDS on 12/27/2014 PASARR number received on 12/27/2014  FL2 transmitted to all facilities in geographic area requested by pt/family on  12/27/2014 FL2 transmitted to all facilities within larger geographic area on   Patient informed that his/her managed care company has contracts with or will negotiate with  certain facilities, including the following:     Patient/family informed of bed offers received:  12/28/2014 Patient chooses bed at [facility] Physician recommends and patient chooses bed at    Patient to be transferred to  on   Patient to be transferred to facility by  Patient and family notified of transfer on  Name of family member notified:    The following physician request were entered in Epic:   Additional Comments:   Alison Murray, MSW, [place] Work [phone]

## 2014-12-27 NOTE — Progress Notes (Signed)
Clinical Social Work Department BRIEF PSYCHOSOCIAL ASSESSMENT 12/27/2014  Patient:  Tanya King, Tanya King     Account Number:  [id number]     Admit date:  11/29/2014  Clinical Social Worker:  Maryln Manuel  Date/Time:  12/27/2014 03:00 PM  Referred by:  Physician  Date Referred:  12/27/2014 Referred for  SNF Placement   Other Referral:   Interview type:  Family Other interview type:    PSYCHOSOCIAL DATA Living Status:  FAMILY Admitted from facility:   Level of care:   Primary support name:  Tanya King Spouse/husband/[phone] Primary support relationship to patient:  SPOUSE Degree of support available:   strong    CURRENT CONCERNS Current Concerns  Post-Acute Placement   Other Concerns:    SOCIAL WORK ASSESSMENT / PLAN CSW received referral for New SNF. Per chart review, CIR re-evaluated pt today and inpatient rehab feels that pt is not demonstrating the needed activity tolerance for the intensity of acute rehab and recommend SNF placement.    CSW met with pt and pt husband, Tanya King at bedside. CSW introduced self and explained role. CSW provided supportive listening as pt husband discussed that [facility] with [facility] and understand that they cannot accept her upon discharge. Pt husand shared that he anticipated that they would say that they would not be able to accept because he did not anticipate that she could tolerate that amount of therapy. CSW discussed rehab at [facility]. CSW clarified pt husbands questions and concerns surrounding SNF.  Pt husband agreeable to [facility] search. CSW explained that some facilities will not be in contract with pt insurance and some facilities will not accept pt due to her need for chemotherapy. Pt husband expressed understanding and shared that he only wants the best for pt. Pt spouse discussed that he is interested in [place] and [place] and CSW explained that [facility] and [facility] will be included in the search that  this CSW intiates, but may not be in contract with pt insurance. Pt husband expressed understanding, but grateful that [facility] is included in the search.    CSW completed FL2 and initiated SNF search to [facility].    CSW to follow up with pt husband regarding SNF bed offers.    Pt insurance requires authorization prior to pt d/c to SNF.    CSW to continue to follow to provide support and assist with pt disposition needs.   Assessment/plan status:  Psychosocial Support/Ongoing Assessment of Needs Other assessment/ plan:   discharge planning   Information/referral to community resources:   [facility] list    PATIENT'S/FAMILY'S RESPONSE TO PLAN OF CARE: Pt sleeping during assessment. Pt awoke at times, but did not engage in conversation and drifted back to sleep. Pt husband is very supportive and attentive to pt needs and hopeful for a facility that will be attentive to pt and supportive to pt with everything pt has been facing lately. Pt husband recogonizes that there may be some limitations as far as facilities are concerned due to pt insurance and need for follow up chemotherapy. Pt husband appreciative of CSW support and assistance with disposition planning.    Alison Murray, MSW, [place] Work [phone]

## 2014-12-27 NOTE — Progress Notes (Signed)
Pt refuses CPAP  RT to monitor and assess as needed.  

## 2014-12-27 NOTE — Progress Notes (Addendum)
Rehab admissions - We have been following pt's case and noted latest note from Dr. Marin Olp. Discussed case with rehab team. At this point, pt is not demonstrating the needed activity tolerance for the intensity of acute rehab. Per latest PT note, "Pt progressing poorly.  Requires HOYER Lift for tranfers and is total assist x 2 for limited mobility. Pt also has private [facility] insurance and it is not likely that insurance would give authorization for inpatient rehab with pt's current functional level.  At this time, we are recommending that skilled nursing be pursued.   Addendum: I called and shared this update with pt's husband. He was agreeable to pursuing SNF and I shared that [place], Education officer, museum will be following up with pt/family for DC planning. Pt's husband had no further questions.  Thank you.  Nanetta Batty, PT Rehabilitation Admissions Coordinator [phone]

## 2014-12-27 NOTE — Progress Notes (Signed)
Tanya King is doing pretty well so far. Her methotrexate level is coming down really nicely. Pharmacy is doing a great job in helping manage the methotrexate levels and leucovorin. She is on leucovorin every 6 hours. Hopefully, the leucovorin will be able to be discontinued soon.  I have put in a consult for inpatient rehabilitation. 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 I think that she will be able to go over there at they will take her.  She's having bowel movements. We will cut back on the lactulose every 12 hours.  She still has the Foley catheter in.. Urine is clear. I want to keep the catheter in for right now.  I will also cut back on her Decadron dosing to every 12 hours.  She's not had any issues with nausea or vomiting. There's been no mouth sores.  She is still eating okay. She is on a dysphagia diet.  His been no change with the weakness overall her right side.  On her physical exam, her vital signs are stable. She is afebrile. Her blood pressure is 141/85. Pulse is 56. Head and neck exam shows no ocular or oral lesion. Shows no palpable cervical or supraclavicular lymph nodes. Lungs are clear. Cardiac exam regular rate and rhythm. Abdomen is soft. She is obese. She has decent bowel sounds. There is no guarding or rebound tenderness. There is no palpable hepatosplenomegaly. Extremities shows no clubbing, cyanosis or edema. Neurological exam shows the weakness on her right side.  Again, we are waiting the methotrexate levels to drop down to below toxic. This should happen today. We can then cut off the leucovorin.  Rehabilitation I think will be the next phase of therapy for her.  As always, we had a very good prayer session. Her faith remains strong.  Pete E.  Psalm 6:2

## 2014-12-28 LAB — METHOTREXATE
Methotrexate: 1.25
Methotrexate: 0.17

## 2014-12-28 LAB — COMPREHENSIVE METABOLIC PANEL
ALBUMIN: 2.4 g/dL — AB (ref 3.5–5.2)
ALK PHOS: 46 U/L (ref 39–117)
ALT: 347 U/L — AB (ref 0–35)
ANION GAP: 6 (ref 5–15)
AST: 143 U/L — ABNORMAL HIGH (ref 0–37)
BILIRUBIN TOTAL: 0.9 mg/dL (ref 0.3–1.2)
BUN: 25 mg/dL — ABNORMAL HIGH (ref 6–23)
CO2: 36 mmol/L — ABNORMAL HIGH (ref 19–32)
CREATININE: 0.71 mg/dL (ref 0.50–1.10)
Calcium: 8.2 mg/dL — ABNORMAL LOW (ref 8.4–10.5)
Chloride: 98 mmol/L (ref 96–112)
GFR calc non Af Amer: >90 mL/min (ref >90)
Glucose, Bld: 110 mg/dL — ABNORMAL HIGH (ref 70–99)
Potassium: 4.4 mmol/L (ref 3.5–5.1)
Sodium: 140 mmol/L (ref 135–145)
Total Protein: 4.7 g/dL — ABNORMAL LOW (ref 6.0–8.3)

## 2014-12-28 LAB — CBC
HCT: 43.8 % (ref 36.0–46.0)
Hemoglobin: 14.3 g/dL (ref 12.0–15.0)
MCH: 30.8 pg (ref 26.0–34.0)
MCHC: 32.6 g/dL (ref 30.0–36.0)
MCV: 94.4 fL (ref 78.0–100.0)
Platelets: 62 10*3/uL — ABNORMAL LOW (ref 150–400)
RBC: 4.64 MIL/uL (ref 3.87–5.11)
RDW: 14 % (ref 11.5–15.5)
WBC: 9.4 10*3/uL (ref 4.0–10.5)

## 2014-12-28 LAB — CHROMOSOME ANALYSIS, BONE MARROW

## 2014-12-28 NOTE — Progress Notes (Signed)
Yesterdays's MTX level = 1.25 which is actually increased, drawn ~110 hours after the start of the MTX infusion. Per leucovorin rescue nomogram, may continue leucovorin at 10mg /m2 IV q6h.  Today's level is pending.  Peggyann Juba, PharmD, BCPS Pager: [phone]  12/28/2014,7:09 AM.

## 2014-12-28 NOTE — Progress Notes (Signed)
Pt refuses CPAP, RT to monitor and assess as needed.  

## 2014-12-28 NOTE — Progress Notes (Signed)
Tanya King is about the same. I can't figure out why her methotrexate level was higher. This really makes no sense to me. Again, the pharmacy staff is doing a great job in managing this. So far, she's had no side effects or, cases from the methotrexate.  Unfortunately, inpatient rehabilitation will not take her. They just don't feel she is capable of doing the necessary inpatient rehabilitation. They recommended skilled nursing facility. Her husband is okay with this. We can start working on this. Again, we have to wait for her methotrexate level to get below toxic before we can see about lead her go.  She is eating okay. She has the dysphagia diet is not having problems with this.  Still has her Foley catheter in. I think we have to keep this in because of her immobility.  Did have a bowel movement yesterday. She has very good urine output.  She's had no obvious pain. She's had no mouth sores. There is no shortness of breath. She's had no cough.    Her labs look okay. Her platelet count is down as 62,000. Hemoglobin 14.3. Her white cell count 9.4. Creatinine is 0.71. Her liver function tests are on the high side. This is from the methotrexate. This should just be transient.  On her physical exam, her vital signs are all stable. Temperature 97.8. Blood pressure 137/50. Head and neck exam shows no ocular or oral lesions. There are no palpable cervical or supraclavicular lymph nodes. there is no mucositis. Lungs are clear. Cardiac exam regular rate and rhythm. Abdomen obese but soft. There is no guarding or rebound tenderness. There is no palpable liver or spleen tip. Extremities shows no movement on the right side. This is chronic. She has trace edema in her legs.  We will continue to monitor her for the methotrexate. Again we will see what her liver function tests show.  I suspect she probably will not be ready for any type of skilled nursing facility discharge until next week.  I  appreciated the great care that she is getting from the staff on [address]!!!  Lum Keas  Exodus 34:10

## 2014-12-28 NOTE — Progress Notes (Signed)
Today's MTX level = 0.17 (per Vanita Ingles at [facility]), drawn ~140 hours (6 days) after the start of the MTX infusion. Per leucovorin rescue nomogram, may continue leucovorin at 10mg /m2 IV q6h.  Note LFTs rising, Dr. Marin Olp aware.  Peggyann Juba, PharmD, BCPS Pager: [phone]  12/28/2014,1:27 PM.

## 2014-12-28 NOTE — Progress Notes (Signed)
CSW continuing to follow.   CSW followed up with pt and pt husband at bedside. Pt sleeping soundly at this time.  CSW provided SNF bed offers to pt husband at bedside. CSW discussed with pt husband that [facility] was unable to offer a bed due to facility not being in contract with pt insurance. Pt husband expressed understanding and discussed that he had spoken to [facility] and understands why they cannot offer.   CSW discussed SNF list with pt husband. CSW clarified pt husbands questions and concerns. Pt husband is hopeful to find a facility that has a private room as pt husband spends a lot of time at bedside and often stays overnight. Pt husband discussed that he would discuss the options with pt son in order to make a decision. Pt husband shared that MD does not feel pt ready for discharge, but pt husband is motivated to research facilities and make decision in order sooner than later in order to go ahead and have plan in place. CSW provided positive reinforcement surrounding that plan.   CSW to continue to follow to provide support and assist with pt disposition needs.   Alison Murray, MSW, [place] [facility] [phone]

## 2014-12-28 NOTE — Progress Notes (Signed)
OT Cancellation Note  Patient Details Name: Tanya King MRN: [id number] DOB: 1951/06/04   Cancelled Treatment:    Reason Eval/Treat Not Completed: Other (comment).  When OT arrived, pt said, "please no".  Verified that she did not want to sit EOB.  Repositioned RUE. Will check back another day.    Issiah Huffaker 12/28/2014, 1:36 PM  Lesle Chris, OTR/L [phone] 12/28/2014

## 2014-12-28 NOTE — Progress Notes (Signed)
PT Cancellation Note  Patient Details Name: Tanya King MRN: [id number] DOB: 1951-04-15   Cancelled Treatment:     pt not able to participate at this time but will continue to check back as schedule permits.   Rica Koyanagi  PTA [facility]  Acute  Rehab Pager      [phone]

## 2014-12-29 DIAGNOSIS — R7989 Other specified abnormal findings of blood chemistry: Secondary | ICD-10-CM

## 2014-12-29 DIAGNOSIS — C8330 Diffuse large B-cell lymphoma, unspecified site: Secondary | ICD-10-CM

## 2014-12-29 LAB — CBC
HEMATOCRIT: 42.5 % (ref 36.0–46.0)
Hemoglobin: 14.4 g/dL (ref 12.0–15.0)
MCH: 31.7 pg (ref 26.0–34.0)
MCHC: 33.9 g/dL (ref 30.0–36.0)
MCV: 93.6 fL (ref 78.0–100.0)
Platelets: 53 10*3/uL — ABNORMAL LOW (ref 150–400)
RBC: 4.54 MIL/uL (ref 3.87–5.11)
RDW: 13.9 % (ref 11.5–15.5)
WBC: 7 10*3/uL (ref 4.0–10.5)

## 2014-12-29 LAB — COMPREHENSIVE METABOLIC PANEL
ALT: 567 U/L — AB (ref 0–35)
ANION GAP: 3 — AB (ref 5–15)
AST: 167 U/L — ABNORMAL HIGH (ref 0–37)
Albumin: 2.5 g/dL — ABNORMAL LOW (ref 3.5–5.2)
Alkaline Phosphatase: 48 U/L (ref 39–117)
BUN: 23 mg/dL (ref 6–23)
CHLORIDE: 103 mmol/L (ref 96–112)
CO2: 32 mmol/L (ref 19–32)
CREATININE: 0.61 mg/dL (ref 0.50–1.10)
Calcium: 8.1 mg/dL — ABNORMAL LOW (ref 8.4–10.5)
GFR calc non Af Amer: >90 mL/min (ref >90)
Glucose, Bld: 124 mg/dL — ABNORMAL HIGH (ref 70–99)
Potassium: 4.4 mmol/L (ref 3.5–5.1)
Sodium: 138 mmol/L (ref 135–145)
Total Bilirubin: 0.9 mg/dL (ref 0.3–1.2)
Total Protein: 5.1 g/dL — ABNORMAL LOW (ref 6.0–8.3)

## 2014-12-29 LAB — METHOTREXATE: Methotrexate: 0.14

## 2014-12-29 NOTE — Progress Notes (Signed)
Mrs. Arave is about the same. She really cannot do much with the right side still. I think that physical therapy is still trying to work with her.  Pharmacy is doing a great job monitoring the methotrexate levels. Her level today is down a little bit. However, it still is not low enough that we can stop the leucovorin.  Her LFTs are on the high side. This, is no surprise, given that she got high-dose methotrexate.  She's had no mouth sores. She still has good urine output. She has no nausea or vomiting. She is eating okay according to her husband.  On her physical exam, her vital signs are all stable. Blood pressure 152/58. Pulse is 62. Temperature afebrile. Oral exam shows no mucositis. Lungs are clear. Cardiac exam regular rate and rhythm. Abdomen obese but soft. There is no guarding or rebound tenderness. There is no palpable hepatomegaly. Extremities shows no clubbing, cyanosis or edema. She has no movement overall the right side. She might be out of move her right toes. Skin exam no rashes.  Her labs are still okay although the liver function tests are on the high side.  I cannot let her go until I know that her liver function tests are coming back to baseline. This may take several more days.  I know that she has to go to skilled nursing. I will like for her to go to skilled nursing but she just is not medically ready yet because of the elevated liver function test. We will continue to monitor these daily.  As always, the staff on [address] is doing an outstanding job.  Pete E  Romans 1:16

## 2014-12-29 NOTE — Progress Notes (Signed)
Speech Language Pathology Treatment: Dysphagia;Cognitive-Linquistic  Patient Details Name: Tanya King MRN: [id number] DOB: 07/05/1951 Today's Date: 12/29/2014 Time: 4967-5916 SLP Time Calculation (min) (ACUTE ONLY): 15 min  Assessment / Plan / Recommendation Clinical Impression  Pt seen for dysphagia and communication intervention. Delayed cough x 1 following nectar thick Coke suspicious for possible penetration. Functional mastication and transit with solid texture. Recommend continue Dys 3 for energy conservation. Pt afebrile, no change in RN documentation of lung status, no recent CXR. Continue nectar thick liquids following swallow precautions. Pt able to accurately answer biographical responsive naming questions with 80%. Delayed responses, accurate responses to y/n questions re: basic needs (head back, tv on etc). Husband present at end of session and updated on progress. Continue intervention.   HPI HPI: Pt is a 64 yo female with recent admit to [facility] with right lower extremity weakness and slurred speech x2-3 weeks.  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  Pt dc'd home over the weekend and returned for brain biopsy to [facility] on Monday 12/04/2014.  Pt with mild bleed s/p brain bx with resultant dysarthria, increased right sided weakness, left hemianopsia and swallow evaluation ordered.  PMH + for smoking, asthma, HTN.  She has been seen for swalloiwng treatment *during previous admit for language as well.    Pertinent Vitals Pain Assessment: No/denies pain  SLP Plan  Continue with current plan of care    Recommendations Diet recommendations: Dysphagia 3 (mechanical soft);Nectar-thick liquid Liquids provided via: Cup;No straw Medication Administration: Whole meds with puree Supervision: Full supervision/cueing for compensatory strategies;Staff to assist with self feeding Compensations: Slow rate;Small sips/bites;Check for pocketing Postural Changes and/or Swallow Maneuvers:  Seated upright 90 degrees;Upright 30-60 min after meal              Oral Care Recommendations: Oral care BID;Oral care before and after PO Follow up Recommendations: Skilled Nursing facility Plan: Continue with current plan of care    GO     Houston Siren 12/29/2014, 11:03 AM   Orbie Pyo Colvin Caroli.Ed [employer] [phone]

## 2014-12-29 NOTE — Progress Notes (Signed)
Physical Therapy Treatment Patient Details Name: Tanya King MRN: [id number] DOB: 1951-08-27 Today's Date: 12/29/2014    History of Present Illness This 64 y.o. femlae admitted 12/02/2014 with AMS, Rt hemiparesis, dyarthria and apraxia.  MRI showed multiple intracranial lesions.  Metastatic worku p done thus far has been negative.  She underwent stereotatic brain biopsy 12/02/2014 with pathology results pending.  PMH includes: aortic stenosis; asthma; HTN    PT Comments    Pt more alert and able to maintain alertness for longer periods.  Assisted from supine to EOB.  Performed static and dynamcic sitting activities x 13 min EOB at [place] bed side table in front of pt with B UE's on top to facilitate upright posture and equalize trunk x 4 min.  Performed AAROM R UE shoulder ADD/ABD using a towel to slide laterally on table.  Pt 10% ADD shoulder mvt and 0% ABD.  Sitting balance is poor and requires constant support to prevent R lean LOB.   assited  back to bed  And found pt to be incont loose stools. Side to side rolling towards pt R pt 0% and towards pt L pt 15% using bed rail and L LE to push off.  Placed HOYER Pad under pt and assisted OOB to recliner.  Increased time to position to comfort and upright to watch the [place] game.  Pt tolerated long session well. Showing slow progress but showing progress. Positive note pt tolerated increased sitting EOB time and some R shoulder mvt.   Follow Up Recommendations  SNF     Equipment Recommendations       Recommendations for Other Services       Precautions / Restrictions Precautions Precautions: Fall Precaution Comments: dense R hemiplegia Restrictions Weight Bearing Restrictions: No    Mobility  Bed Mobility Overal bed mobility: Needs Assistance Bed Mobility: Rolling;Supine to Sit Rolling: Total assist;+2 for physical assistance (pt 15%)   Supine to sit: +2 for physical assistance;+2 for safety/equipment;Total assist (pt  5%) Sit to supine: +2 for physical assistance;+2 for safety/equipment;Total assist (pt 5%)   General bed mobility comments: performed bed mobiltiy side to side rolling to perform hygiene care from loose BM  Transfers                 General transfer comment: Hoyer Lift from bed to recliner  Ambulation/Gait         Gait velocity: pt non amb at this time.        Stairs            Wheelchair Mobility    Modified Rankin (Stroke Patients Only)       Balance                                    Cognition Arousal/Alertness: Awake/alert                          Exercises      General Comments        Pertinent Vitals/Pain      Home Living                      Prior Function            PT Goals (current goals can now be found in the care plan section) Progress towards PT goals: Progressing toward  goals    Frequency  Min 3X/week    PT Plan      Co-evaluation             End of Session Equipment Utilized During Treatment: Oxygen Activity Tolerance: Treatment limited secondary to medical complications (Comment) (severe dense hemiparesis) Patient left: in chair;with call bell/phone within reach;with family/visitor present     Time: 1435-1530 PT Time Calculation (min) (ACUTE ONLY): 55 min  Charges:  $Therapeutic Exercise: 8-22 mins $Therapeutic Activity: 38-52 mins                    G Codes:      Rica Koyanagi  PTA [facility]  Acute  Rehab Pager      [phone]

## 2014-12-29 NOTE — Progress Notes (Signed)
Today's MTX level = 0.14, drawn ~160 hours (~7 days) after the start of the MTX infusion. Level slowly coming down. Had discussion with Dr. Marin Olp and plan is to continue same leucovorin dose of 10mg /m2 q6 for now   Note LFTs rising, Dr. Marin Olp aware.  Peggyann Juba, PharmD, BCPS Pager: [phone]  12/29/2014,10:51 AM.

## 2014-12-29 NOTE — Progress Notes (Signed)
CSW continuing to follow.   CSW followed up with pt and pt husband at bedside.   Pt husband discussed that he was able to speak with pt son and other friends and family and pt and pt husband are agreeable to bed offer from [facility].   CSW discussed with pt husband that [facility] could not guarantee a private room upon admission, but pt could be placed on the waiting list. Pt husband expressed understanding and wants pt to go to [facility] even if it is a shared room initially.   Pt husband expressed that pt is progressing slowly, but sees progress. Pt husband shared that he is eager for pt to get to [facility] and be able to receive therapy daily at the facility.   CSW contacted [facility] and notified facility that pt and pt husband want to accept bed offer. CSW will continue to updated [facility] on anticipated discharge date in order to ensure the facility has insurance authorization.   CSW to continue to follow to provide support and assist with pt discharge planning needs.   Tanya King, MSW, [place] [facility] [phone]

## 2014-12-30 ENCOUNTER — Encounter: Payer: Self-pay | Admitting: Hematology & Oncology

## 2014-12-30 LAB — METHOTREXATE: Methotrexate: 0.11

## 2014-12-30 LAB — CBC
HEMATOCRIT: 42.1 % (ref 36.0–46.0)
Hemoglobin: 14 g/dL (ref 12.0–15.0)
MCH: 30.9 pg (ref 26.0–34.0)
MCHC: 33.3 g/dL (ref 30.0–36.0)
MCV: 92.9 fL (ref 78.0–100.0)
Platelets: 44 10*3/uL — ABNORMAL LOW (ref 150–400)
RBC: 4.53 MIL/uL (ref 3.87–5.11)
RDW: 13.8 % (ref 11.5–15.5)
WBC: 8.6 10*3/uL (ref 4.0–10.5)

## 2014-12-30 LAB — COMPREHENSIVE METABOLIC PANEL
ALBUMIN: 2.5 g/dL — AB (ref 3.5–5.2)
ALK PHOS: 52 U/L (ref 39–117)
ALT: 664 U/L — AB (ref 0–35)
AST: 143 U/L — ABNORMAL HIGH (ref 0–37)
Anion gap: 7 (ref 5–15)
BUN: 20 mg/dL (ref 6–23)
CO2: 30 mmol/L (ref 19–32)
Calcium: 8.3 mg/dL — ABNORMAL LOW (ref 8.4–10.5)
Chloride: 100 mmol/L (ref 96–112)
Creatinine, Ser: 0.54 mg/dL (ref 0.50–1.10)
GFR calc non Af Amer: >90 mL/min (ref >90)
GLUCOSE: 118 mg/dL — AB (ref 70–99)
POTASSIUM: 4.3 mmol/L (ref 3.5–5.1)
Sodium: 137 mmol/L (ref 135–145)
TOTAL PROTEIN: 5 g/dL — AB (ref 6.0–8.3)
Total Bilirubin: 0.7 mg/dL (ref 0.3–1.2)

## 2014-12-30 MED ORDER — DEXAMETHASONE 2 MG PO TABS
2.0000 mg | ORAL_TABLET | Freq: Two times a day (BID) | ORAL | Status: DC
  Administered 2014-12-30 – 2015-01-03 (×8): 2 mg via ORAL
  Filled 2014-12-30 (×9): qty 1

## 2014-12-30 NOTE — Progress Notes (Signed)
Physical Therapy Treatment Patient Details Name: Tanya King MRN: [id number] DOB: 12-15-1950 Today's Date: 12/30/2014    History of Present Illness This 64 y.o. femlae admitted 11/21/2014 with AMS, Rt hemiparesis, dyarthria and apraxia.  MRI showed multiple intracranial lesions.  Metastatic worku p done thus far has been negative.  She underwent stereotatic brain biopsy 12/14/2014 with pathology results pending.  PMH includes: aortic stenosis; asthma; HTN    PT Comments    Assisted pt to EOB to perform static and dynamic trunk TE's.  Pt was more able to support self at midline when R UE is supported (not pulling her down).  One person sitting on pt's R stabilizing and the other in front. Used stackable cups, had pt reach beyond center and cross midline from L to R and R to L.  Pt more able to control trunk.  Then placed a bed side table in front of pt and performed AAROM R UE shoulder ABd/ADd using a towel simulating "cleaning the table".  Pt Add 15% and ABD 0%. Tolerated increased EOB activity 15 min .   Returned pt to supine and placed HOYER pad to transfer pt from bed to recliner.  Increased time to position to upright.  Follow Up Recommendations        Equipment Recommendations       Recommendations for Other Services       Precautions / Restrictions Precautions Precautions: Fall Precaution Comments: dense R hemiplegia Restrictions Weight Bearing Restrictions: No    Mobility  Bed Mobility Overal bed mobility: Needs Assistance Bed Mobility: Rolling;Supine to Sit Rolling: Total assist;+2 for physical assistance   Supine to sit: +2 for physical assistance;+2 for safety/equipment;Total assist Sit to supine: +2 for physical assistance;+2 for safety/equipment;Total assist   General bed mobility comments: performed bed mobiltiy side to side rolling to place [employer] under pt.  Rolling to L pt 0% .  Rolling to R pt 15% using L LE and rail.    Transfers Overall transfer level:  Needs assistance               General transfer comment: [employer] fast streamer from bed to recliner  Ambulation/Gait                 Stairs            Wheelchair Mobility    Modified Rankin (Stroke Patients Only)       Balance                                    Cognition Arousal/Alertness: Awake/alert                          Exercises      General Comments        Pertinent Vitals/Pain      Home Living                      Prior Function            PT Goals (current goals can now be found in the care plan section)      Frequency       PT Plan      Co-evaluation             End of Session           Time: 1210-1240 PT  Time Calculation (min) (ACUTE ONLY): 30 min  Charges:  $Therapeutic Exercise: 8-22 mins $Therapeutic Activity: 8-22 mins                    G Codes:      Rica Koyanagi  PTA [facility]  Acute  Rehab Pager      [phone]

## 2014-12-30 NOTE — Progress Notes (Signed)
Tanya King continues to stabilize. She is in physical therapy yesterday. She actually has some movement in her right hand. She squeezes my hand a little bit today.  I very much appreciate all the hard work that physical therapy is doing.  Her methotrexate level is still coming down slowly. Pharmacy is managing this. I very much appreciate all of their expertise.  Her liver function tests, I think, I started stabilize. Her SGOT is down a little bit and the SGPT is up a little bit. She has had no problems with nausea vomiting. His been no diarrhea. She's had no abdominal pain.  She's still eating fairly well. She is on the dysphagia diet.  She's had no fever.  On physical exam, her vital signs are temperature of 97.7. Pulse 57. Blood pressure 149/56. Head and neck exam shows no ocular or oral lesions. There are no palpable cervical or supraclavicular lymph nodes. She had no mucositis in the oral cavity. Pupils react appropriately. Lungs are with some slight decrease at the bases. Shows good air movement bilaterally. Cardiac exam regular rate and rhythm with no murmurs, rubs or bruits. Abdomen is soft. She has good bowel sounds. She is obese. She has no guarding or rebound tenderness. There is no palpable liver or spleen tip. Extremities shows some slight movement in the right hand. She is still move her right toes a little bit. No edema is noted in her legs. Skin exam shows no rashes. Neurological exam shows the weakness on the right side. She still has the dysarthria. All this is fairly stable.  With the labs, whites white cell count is 8.6. Hemoglobin 14. Platelet count 44. Creatinine is 0.54. Calcium 8.3. Albumin 2.5.  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 Again, her liver function tests are high but hopefully are stabilized. This is all from the methotrexate. She still is on leucovorin rescue.  She still is not ready for discharge until I know that  her liver function tests are improving area and I suspect that she probably will take another 3 or 4 days as an inpatient.  I am optimistic with respect to the strength in her right hand. Hopefully this will improve.  She will go to skilled nursing once she is medically able to.  The staff is doing great job with her. Her husband is very pleased with the exceptional care that she is getting from the staff on [address] [facility]!!  Pete E.  Romans 1:16

## 2014-12-30 NOTE — Progress Notes (Signed)
Pt refused to wear CPAP tonight. She does wear one at home per family. Did not like our machine. Told pt that if she changed her mind at anytime and wanted to wear a CPAP to call and RT would get one set up. Family at bedside and aware.

## 2014-12-31 DIAGNOSIS — D6959 Other secondary thrombocytopenia: Secondary | ICD-10-CM

## 2014-12-31 DIAGNOSIS — D72828 Other elevated white blood cell count: Secondary | ICD-10-CM

## 2014-12-31 DIAGNOSIS — C8599 Non-Hodgkin lymphoma, unspecified, extranodal and solid organ sites: Principal | ICD-10-CM

## 2014-12-31 LAB — COMPREHENSIVE METABOLIC PANEL
ALT: 449 U/L — ABNORMAL HIGH (ref 0–35)
ANION GAP: 9 (ref 5–15)
AST: 56 U/L — ABNORMAL HIGH (ref 0–37)
Albumin: 2.6 g/dL — ABNORMAL LOW (ref 3.5–5.2)
Alkaline Phosphatase: 56 U/L (ref 39–117)
BUN: 18 mg/dL (ref 6–23)
CALCIUM: 8.3 mg/dL — AB (ref 8.4–10.5)
CHLORIDE: 99 mmol/L (ref 96–112)
CO2: 29 mmol/L (ref 19–32)
Creatinine, Ser: 0.61 mg/dL (ref 0.50–1.10)
GFR calc Af Amer: >90 mL/min (ref >90)
GFR calc non Af Amer: >90 mL/min (ref >90)
Glucose, Bld: 121 mg/dL — ABNORMAL HIGH (ref 70–99)
POTASSIUM: 4.2 mmol/L (ref 3.5–5.1)
Sodium: 137 mmol/L (ref 135–145)
TOTAL PROTEIN: 5.3 g/dL — AB (ref 6.0–8.3)
Total Bilirubin: 1.1 mg/dL (ref 0.3–1.2)

## 2014-12-31 LAB — URINE CULTURE

## 2014-12-31 LAB — CBC
HEMATOCRIT: 42.2 % (ref 36.0–46.0)
HEMOGLOBIN: 13.9 g/dL (ref 12.0–15.0)
MCH: 30.3 pg (ref 26.0–34.0)
MCHC: 32.9 g/dL (ref 30.0–36.0)
MCV: 92.1 fL (ref 78.0–100.0)
Platelets: 40 10*3/uL — ABNORMAL LOW (ref 150–400)
RBC: 4.58 MIL/uL (ref 3.87–5.11)
RDW: 13.8 % (ref 11.5–15.5)
WBC: 11.1 10*3/uL — AB (ref 4.0–10.5)

## 2014-12-31 LAB — METHOTREXATE: Methotrexate: 0.1

## 2014-12-31 NOTE — Progress Notes (Signed)
IP PROGRESS NOTE  Subjective:    Patient was deep in sleep this morning. She had difficulty waking up but was able to do so. She is able to follow simple commands but overall very somnolent. She did not report her CPAP overnight which might have contributed to her lethargy this morning. She voices no complaints otherwise.  Objective:  Vital signs in last 24 hours: Temp:  [98 F (36.7 C)-98.4 F (36.9 C)] 98 F (36.7 C) (03/12 0457) Pulse Rate:  [77-97] 77 (03/12 0457) Resp:  [20] 20 (03/12 0457) BP: (149-167)/(59-78) 149/59 mmHg (03/12 0457) SpO2:  [97 %-98 %] 97 % (03/12 0457) Weight change:  Last BM Date: 12/28/14  Intake/Output from previous day: 03/11 0701 - 03/12 0700 In: 420 [P.O.:420] Out: 3100 [Urine:3100]  Mouth: mucous membranes moist, pharynx normal without lesions Resp: clear to auscultation bilaterally Cardio: regular rate and rhythm, S1, S2 normal, no murmur, click, rub or gallop GI: soft, non-tender; bowel sounds normal; no masses,  no organomegaly Extremities: extremities normal, atraumatic, no cyanosis or edema  Portacath/PICC-without erythema  Lab Results:  Recent Labs  12/30/14 0510 12/31/14 0500  WBC 8.6 11.1*  HGB 14.0 13.9  HCT 42.1 42.2  PLT 44* PENDING    BMET  Recent Labs  12/30/14 0510 12/31/14 0500  NA 137 137  K 4.3 4.2  CL 100 99  CO2 30 29  GLUCOSE 118* 121*  BUN 20 18  CREATININE 0.54 0.61  CALCIUM 8.3* 8.3*     Medications: I have reviewed the patient's current medications.  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   She tolerated chemotherapy well and her methotrexate levels are declining rather slowly. She continues to be on leucovorin rescue. Her last methotrexate level is 0.11 on 12/30/2014. She continues to have good urine output and normal creatinine at this time.  Right-sided weakness Due to #1 Appreciate OT-PT follow-up to help her improve her ambulation and balance  Leukocytosis This is likely due to steroids, and a history of tobacco abuse in the recent past No intervention is indicated at this time  Thrombocytopenia:  Likely related due to her lymphoma and chemotherapy. She has no active bleeding I will continue to monitor this on a regular basis.  DVT prophylaxis We'll use mechanical prophylaxis given her thrombocytopenia.  Full Code   LOS: 19 days   NOBSJG,GEZMO 12/31/2014, 7:48 AM

## 2014-12-31 NOTE — Progress Notes (Signed)
Pt refused CPAP for tonight. Pt was encouraged to bring her home CPAP machine and mask tomorrow. Pt remains on 3Lpm nasal cannula. RN notified. RT will continue to monitor as needed.

## 2015-01-01 LAB — COMPREHENSIVE METABOLIC PANEL
ALBUMIN: 2.5 g/dL — AB (ref 3.5–5.2)
ALT: 273 U/L — ABNORMAL HIGH (ref 0–35)
AST: 34 U/L (ref 0–37)
Alkaline Phosphatase: 57 U/L (ref 39–117)
Anion gap: 7 (ref 5–15)
BUN: 18 mg/dL (ref 6–23)
CO2: 29 mmol/L (ref 19–32)
Calcium: 8.3 mg/dL — ABNORMAL LOW (ref 8.4–10.5)
Chloride: 101 mmol/L (ref 96–112)
Creatinine, Ser: 0.5 mg/dL (ref 0.50–1.10)
GFR calc non Af Amer: >90 mL/min (ref >90)
Glucose, Bld: 129 mg/dL — ABNORMAL HIGH (ref 70–99)
Potassium: 4.2 mmol/L (ref 3.5–5.1)
SODIUM: 137 mmol/L (ref 135–145)
Total Bilirubin: 0.8 mg/dL (ref 0.3–1.2)
Total Protein: 5.5 g/dL — ABNORMAL LOW (ref 6.0–8.3)

## 2015-01-01 LAB — CBC
HEMATOCRIT: 39.5 % (ref 36.0–46.0)
Hemoglobin: 13.1 g/dL (ref 12.0–15.0)
MCH: 30.9 pg (ref 26.0–34.0)
MCHC: 33.2 g/dL (ref 30.0–36.0)
MCV: 93.2 fL (ref 78.0–100.0)
PLATELETS: 43 10*3/uL — AB (ref 150–400)
RBC: 4.24 MIL/uL (ref 3.87–5.11)
RDW: 13.9 % (ref 11.5–15.5)
WBC: 7.1 10*3/uL (ref 4.0–10.5)

## 2015-01-01 NOTE — Progress Notes (Signed)
IP PROGRESS NOTE  Subjective:   No issues noted overnight. She slept well without any issues. She has not reported any altered mental status, fevers or chills or any sweats.   Objective:  Vital signs in last 24 hours: Temp:  [98 F (36.7 C)-98.7 F (37.1 C)] 98 F (36.7 C) (03/13 0609) Pulse Rate:  [65-80] 65 (03/13 0609) Resp:  [20-24] 24 (03/13 0609) BP: (79-133)/(43-57) 124/43 mmHg (03/13 0609) SpO2:  [92 %-100 %] 93 % (03/13 0752) Weight change:  Last BM Date: 12/28/14  Intake/Output from previous day: 03/12 0701 - 03/13 0700 In: -  Out: 1350 [Urine:1350]  Mouth: mucous membranes moist, pharynx normal without lesions Resp: clear to auscultation bilaterally Cardio: regular rate and rhythm, S1, S2 normal, no murmur, click, rub or gallop GI: soft, non-tender; bowel sounds normal; no masses,  no organomegaly Extremities: extremities normal, atraumatic, no cyanosis or edema    Lab Results:  Recent Labs  12/31/14 0500 01/01/15 0500  WBC 11.1* 7.1  HGB 13.9 13.1  HCT 42.2 39.5  PLT 40* 43*    BMET  Recent Labs  12/31/14 0500 01/01/15 0500  NA 137 137  K 4.2 4.2  CL 99 101  CO2 29 29  GLUCOSE 121* 129*  BUN 18 18  CREATININE 0.61 0.50  CALCIUM 8.3* 8.3*     Medications: I have reviewed the patient's current medications.  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   She tolerated chemotherapy well and her methotrexate levels are declining rather slowly. She continues to be on leucovorin rescue. Her last methotrexate level is  0.10 on 12/31/2014. She continues to have good urine output and normal creatinine at this time.  Right-sided weakness Due to #1 Appreciate OT-PT follow-up to help her improve her ambulation and balance  Leukocytosis This is likely due to steroids and her white cell count have normalized.  Thrombocytopenia:  Likely related due to her lymphoma and chemotherapy. She has no active bleeding I will continue to monitor this on a regular basis.  DVT prophylaxis We will use mechanical prophylaxis given her thrombocytopenia.  Full Code   LOS: 20 days   [id number],YVOPF 01/01/2015, 8:39 AM

## 2015-01-01 NOTE — Progress Notes (Signed)
Rt note:  Pt refuses cpap at this time.

## 2015-01-02 ENCOUNTER — Inpatient Hospital Stay (HOSPITAL_COMMUNITY): Payer: 59

## 2015-01-02 ENCOUNTER — Other Ambulatory Visit: Payer: Self-pay | Admitting: Oncology

## 2015-01-02 ENCOUNTER — Encounter: Payer: Self-pay | Admitting: Hematology & Oncology

## 2015-01-02 DIAGNOSIS — D496 Neoplasm of unspecified behavior of brain: Secondary | ICD-10-CM

## 2015-01-02 DIAGNOSIS — J9601 Acute respiratory failure with hypoxia: Secondary | ICD-10-CM

## 2015-01-02 DIAGNOSIS — C8590 Non-Hodgkin lymphoma, unspecified, unspecified site: Secondary | ICD-10-CM

## 2015-01-02 LAB — CBC
HCT: 40.2 % (ref 36.0–46.0)
HCT: 42.1 % (ref 36.0–46.0)
HEMOGLOBIN: 13.8 g/dL (ref 12.0–15.0)
Hemoglobin: 13.4 g/dL (ref 12.0–15.0)
MCH: 30.9 pg (ref 26.0–34.0)
MCH: 31.3 pg (ref 26.0–34.0)
MCHC: 33.3 g/dL (ref 30.0–36.0)
MCHC: 32.8 g/dL (ref 30.0–36.0)
MCV: 92.6 fL (ref 78.0–100.0)
MCV: 95.5 fL (ref 78.0–100.0)
Platelets: 47 10*3/uL — ABNORMAL LOW (ref 150–400)
RBC: 4.34 MIL/uL (ref 3.87–5.11)
RBC: 4.41 MIL/uL (ref 3.87–5.11)
RDW: 13.8 % (ref 11.5–15.5)
RDW: 14 % (ref 11.5–15.5)
WBC: 5.4 10*3/uL (ref 4.0–10.5)
WBC: 5.7 10*3/uL (ref 4.0–10.5)

## 2015-01-02 LAB — COMPREHENSIVE METABOLIC PANEL
ALT: 194 U/L — ABNORMAL HIGH (ref 0–35)
AST: 29 U/L (ref 0–37)
Albumin: 2.6 g/dL — ABNORMAL LOW (ref 3.5–5.2)
Alkaline Phosphatase: 57 U/L (ref 39–117)
Anion gap: 8 (ref 5–15)
BUN: 17 mg/dL (ref 6–23)
CO2: 30 mmol/L (ref 19–32)
Calcium: 8.6 mg/dL (ref 8.4–10.5)
Chloride: 100 mmol/L (ref 96–112)
Creatinine, Ser: 0.5 mg/dL (ref 0.50–1.10)
GFR calc Af Amer: >90 mL/min (ref >90)
GFR calc non Af Amer: >90 mL/min (ref >90)
Glucose, Bld: 125 mg/dL — ABNORMAL HIGH (ref 70–99)
Potassium: 4.5 mmol/L (ref 3.5–5.1)
Sodium: 138 mmol/L (ref 135–145)
Total Bilirubin: 0.8 mg/dL (ref 0.3–1.2)
Total Protein: 5.5 g/dL — ABNORMAL LOW (ref 6.0–8.3)

## 2015-01-02 LAB — BLOOD GAS, ARTERIAL
Acid-base deficit: 8.2 mmol/L — ABNORMAL HIGH (ref 0.0–2.0)
Bicarbonate: 21 mEq/L (ref 20.0–24.0)
Drawn by: 308601
FIO2: 1 %
MECHVT: 440 mL
O2 Saturation: 87.3 %
PEEP/CPAP: 20 cmH2O
Patient temperature: 98.6
RATE: 22 resp/min
TCO2: 19.8 mmol/L (ref 0–100)
pCO2 arterial: 60.4 mmHg (ref 35.0–45.0)
pH, Arterial: 7.167 — CL (ref 7.350–7.450)
pO2, Arterial: 69.9 mmHg — ABNORMAL LOW (ref 80.0–100.0)

## 2015-01-02 LAB — METHOTREXATE
METHOTREXATE: 0.06
Methotrexate: 0.08

## 2015-01-02 LAB — LEVETIRACETAM LEVEL: LEVETIRACETAM: 4.3 ug/mL — AB (ref 10.0–40.0)

## 2015-01-02 LAB — GLUCOSE, CAPILLARY: GLUCOSE-CAPILLARY: 117 mg/dL — AB (ref 70–99)

## 2015-01-02 MED ORDER — SODIUM CHLORIDE 0.9 % IV BOLUS (SEPSIS)
1000.0000 mL | Freq: Once | INTRAVENOUS | Status: AC
  Administered 2015-01-02: 1000 mL via INTRAVENOUS

## 2015-01-02 MED ORDER — MIDAZOLAM BOLUS VIA INFUSION
1.0000 mg | INTRAVENOUS | Status: DC | PRN
  Filled 2015-01-02: qty 2

## 2015-01-02 MED ORDER — LORAZEPAM 2 MG/ML IJ SOLN
INTRAMUSCULAR | Status: AC
  Administered 2015-01-02: 2 mg
  Filled 2015-01-02: qty 1

## 2015-01-02 MED ORDER — NOREPINEPHRINE BITARTRATE 1 MG/ML IV SOLN
0.0000 ug/min | INTRAVENOUS | Status: DC
  Administered 2015-01-02: 2 ug/min via INTRAVENOUS
  Administered 2015-01-03: 40 ug/min via INTRAVENOUS
  Filled 2015-01-02 (×3): qty 4

## 2015-01-02 MED ORDER — MIDAZOLAM HCL 2 MG/2ML IJ SOLN
2.0000 mg | Freq: Once | INTRAMUSCULAR | Status: AC
  Administered 2015-01-02: 2 mg via INTRAVENOUS

## 2015-01-02 MED ORDER — SODIUM BICARBONATE 8.4 % IV SOLN
INTRAVENOUS | Status: DC
  Administered 2015-01-02 – 2015-01-03 (×2): via INTRAVENOUS
  Filled 2015-01-02 (×3): qty 150

## 2015-01-02 MED ORDER — PROPOFOL 10 MG/ML IV EMUL
5.0000 ug/kg/min | INTRAVENOUS | Status: DC
  Filled 2015-01-02: qty 100

## 2015-01-02 MED ORDER — FENTANYL CITRATE 0.05 MG/ML IJ SOLN
INTRAMUSCULAR | Status: AC
  Filled 2015-01-02: qty 2

## 2015-01-02 MED ORDER — FENTANYL BOLUS VIA INFUSION
50.0000 ug | INTRAVENOUS | Status: DC | PRN
Start: 2015-01-02 — End: 2015-01-03
  Filled 2015-01-02: qty 50

## 2015-01-02 MED ORDER — SODIUM CHLORIDE 0.9 % IV SOLN: 250.0000 mL | INTRAVENOUS | Status: DC | PRN

## 2015-01-02 MED ORDER — BOOST / RESOURCE BREEZE PO LIQD
1.0000 | Freq: Three times a day (TID) | ORAL | Status: DC
  Administered 2015-01-02: 1 via ORAL

## 2015-01-02 MED ORDER — PIPERACILLIN-TAZOBACTAM 3.375 G IVPB
3.3750 g | Freq: Three times a day (TID) | INTRAVENOUS | Status: DC
  Administered 2015-01-03: 3.375 g via INTRAVENOUS
  Filled 2015-01-02: qty 50

## 2015-01-02 MED ORDER — SODIUM CHLORIDE 0.9 % IV SOLN
25.0000 ug/h | INTRAVENOUS | Status: DC
  Administered 2015-01-02: 50 ug/h via INTRAVENOUS
  Filled 2015-01-02: qty 50

## 2015-01-02 MED ORDER — VANCOMYCIN HCL 10 G IV SOLR
2500.0000 mg | Freq: Once | INTRAVENOUS | Status: AC
  Administered 2015-01-03: 2500 mg via INTRAVENOUS
  Filled 2015-01-02: qty 2500

## 2015-01-02 MED ORDER — PIPERACILLIN-TAZOBACTAM 3.375 G IVPB 30 MIN
3.3750 g | Freq: Once | INTRAVENOUS | Status: AC
  Administered 2015-01-03: 3.375 g via INTRAVENOUS
  Filled 2015-01-02: qty 50

## 2015-01-02 MED ORDER — FENTANYL CITRATE 0.05 MG/ML IJ SOLN: 50.0000 ug | Freq: Once | INTRAMUSCULAR | Status: DC

## 2015-01-02 MED ORDER — MIDAZOLAM HCL 2 MG/2ML IJ SOLN: 1.0000 mg | Freq: Once | INTRAMUSCULAR | Status: DC

## 2015-01-02 MED ORDER — SODIUM CHLORIDE 0.9 % IV SOLN
0.0000 mg/h | INTRAVENOUS | Status: DC
  Administered 2015-01-02: 1 mg/h via INTRAVENOUS
  Filled 2015-01-02 (×2): qty 10

## 2015-01-02 MED ORDER — LEVETIRACETAM IN NACL 500 MG/100ML IV SOLN
500.0000 mg | Freq: Two times a day (BID) | INTRAVENOUS | Status: DC
Start: 2015-01-02 — End: 2015-01-03
  Administered 2015-01-03 (×2): 500 mg via INTRAVENOUS
  Filled 2015-01-02 (×3): qty 100

## 2015-01-02 MED ORDER — FENTANYL CITRATE 0.05 MG/ML IJ SOLN
50.0000 ug | Freq: Once | INTRAMUSCULAR | Status: AC
  Administered 2015-01-02: 50 ug via INTRAVENOUS

## 2015-01-02 NOTE — Consult Note (Signed)
PULMONARY / CRITICAL CARE MEDICINE   Name: Tanya King MRN: [id number] DOB: 1951/07/23    ADMISSION DATE:  12/11/2014 CONSULTATION DATE:  01/02/2015  REFERRING MD :  Marin Olp  CHIEF COMPLAINT:  Cardiac arrest  INITIAL PRESENTATION: 64 year old female who presented 2/22 to [facility] for brain biopsy after recent discovery of tumor. Had small hemorrhage after biopsy and remained inpatient for some time. Also had R sided hemiparesis. 3/14 suffered cardiac arrest 2nd to apparent seizure induced aspiration. PCCM to assume care.   STUDIES:  2/18 MRI Brain > Multiple enhancing mass lesions in the brain, largest in left basal ganglia. Vasogenic edema around lesions. Suspicious for CNS lymphoma.  2/18 CT chest/abd/pelvis > Stable 12 x 10 mm lingular and 4 mm RIGHT middle lobe nodules. Bibasilar atelectasis. Question small hepatic cyst.  Bilateral inguinal and tiny umbilical hernias. 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 No new lesions have developed since prior MR. Slight improvement LEFT-to-RIGHT shift following steroid administration.  SIGNIFICANT EVENTS: 2/22 > brain biopsy with some hemorrhage, admitted. R sided hemiplegia.  3/1 > transferred to [facility] for chemo, still very limited movement R side 3/14 > seizure > aspiration > arrest> intubated to ICU > limited code, no CPR.   HISTORY OF PRESENT ILLNESS:  64 year old female with PMH as below. Was recently found to have multiple intracranial lesions and presented to [facility] for biopsy 2/70, which was complicated by some post-procedural hemorrhage near the site of biopsy. She had resultant R sided hemiparesis and was kept as an inpatient in light of these events. She was slowly improving and was started on chemotherapy which has been managed by Dr. Marin Olp over the course of her hospitalization. She was moved to [facility] for this therapy. While at [facility] on 3/14 she was noted to have a seizure which presumably resulted in  aspiration. This caused her to lose pulses and ACLS was initiated. She was pulseless off and on for 40 minutes PEA. She was intubated and transferred to ICU and subsequently coded again with 5-10 mins ACLS before ROSC. PCCM to assume care.   PAST MEDICAL HISTORY :   has a past medical history of Asthma; Hypercholesteremia; GERD (gastroesophageal reflux disease); Sleep apnea; Edema; HTN (hypertension); Allergic rhinitis; Vitamin B12 deficiency; RLS (restless legs syndrome); Vitamin deficiency; Obesity; Aortic stenosis; Edema; Heart murmur; Carotid artery occlusion; and COPD (chronic obstructive pulmonary disease).  has past surgical history that includes Tubal ligation and Craniotomy (N/A, 11/27/2014). Prior to Admission medications   Medication Sig Start Date End Date Taking? Authorizing Provider  albuterol (PROVENTIL HFA;VENTOLIN HFA) 108 (90 BASE) MCG/ACT inhaler Inhale 2 puffs into the lungs every 6 (six) hours as needed. wheezing   Yes Historical Provider, MD  Cholecalciferol (VITAMIN D) 2000 UNITS tablet Take 2,000 Units by mouth daily.   Yes Historical Provider, MD  CRESTOR 10 MG tablet Take 1 tablet by mouth every evening. 11/17/14  Yes Historical Provider, MD  fluticasone-salmeterol (ADVAIR HFA) 115-21 MCG/ACT inhaler Inhale 2 puffs into the lungs 2 (two) times daily.   Yes Historical Provider, MD  losartan-hydrochlorothiazide (HYZAAR) 100-12.5 MG per tablet Take 1 tablet by mouth daily. 11/17/14  Yes Historical Provider, MD  dexamethasone (DECADRON) 4 MG tablet Take 1 tablet (4 mg total) by mouth 3 (three) times daily. 12/15/14   Newman Pies, MD  levETIRAcetam (KEPPRA) 500 MG tablet Take 1 tablet (500 mg total) by mouth 2 (two) times daily. 12/15/14   Newman Pies, MD  Allergies  Allergen Reactions  . Lisinopril     Feels like she is burning up  . Shellfish Allergy Rash    FAMILY HISTORY:  indicated that her mother is deceased. She indicated that her father is deceased. She  indicated that her brother is deceased.  SOCIAL HISTORY:  reports that she has been smoking Cigarettes.  She has a 42 pack-year smoking history. She does not have any smokeless tobacco history on file. She reports that she does not drink alcohol or use illicit drugs.  REVIEW OF SYSTEMS:  Unable to assess  SUBJECTIVE:   VITAL SIGNS: Temp:  [97.7 F (36.5 C)] 97.7 F (36.5 C) (03/14 1514) Pulse Rate:  [64-131] 131 (03/14 2038) Resp:  [20-40] 40 (03/14 2038) BP: (163-167)/(52-60) 167/52 mmHg (03/14 1514) SpO2:  [93 %-97 %] 94 % (03/14 2038) FiO2 (%):  [100 %] 100 % (03/14 2038) HEMODYNAMICS:   VENTILATOR SETTINGS: Vent Mode:  [-] PRVC FiO2 (%):  [100 %] 100 % Set Rate:  [14 bmp-20 bmp] 20 bmp Vt Set:  [440 mL] 440 mL PEEP:  [10 cmH20-20 cmH20] 20 cmH20 INTAKE / OUTPUT:  Intake/Output Summary (Last 24 hours) at 01/02/15 2109 Last data filed at 01/02/15 1745  Gross per 24 hour  Intake   1605 ml  Output   3700 ml  Net  -2095 ml    PHYSICAL EXAMINATION: General:  Obese female in distress on vent Neuro:  Obtunded on vent HEENT:  [place]/AT, PERRL, no JVD noted Cardiovascular:  Tachy, regular, no MRG Lungs:  Very coarse bilateral breath sounds Abdomen:  Obese, soft, non-distended Musculoskeletal:  No acute deformity Skin:  Grossly intact  LABS:  CBC  Recent Labs Lab 12/31/14 0500 01/01/15 0500 01/02/15 0500  WBC 11.1* 7.1 5.4  HGB 13.9 13.1 13.4  HCT 42.2 39.5 40.2  PLT 40* 43* 47*   Coag's No results for input(s): APTT, INR in the last 168 hours. BMET  Recent Labs Lab 12/31/14 0500 01/01/15 0500 01/02/15 0500  NA 137 137 138  K 4.2 4.2 4.5  CL 99 101 100  CO2 29 29 30   BUN 18 18 17   CREATININE 0.61 0.50 0.50  GLUCOSE 121* 129* 125*   Electrolytes  Recent Labs Lab 12/31/14 0500 01/01/15 0500 01/02/15 0500  CALCIUM 8.3* 8.3* 8.6   Sepsis Markers No results for input(s): LATICACIDVEN, PROCALCITON, O2SATVEN in the last 168 hours. ABG No results  for input(s): PHART, PCO2ART, PO2ART in the last 168 hours. Liver Enzymes  Recent Labs Lab 12/31/14 0500 01/01/15 0500 01/02/15 0500  AST 56* 34 29  ALT 449* 273* 194*  ALKPHOS 56 57 57  BILITOT 1.1 0.8 0.8  ALBUMIN 2.6* 2.5* 2.6*   Cardiac Enzymes No results for input(s): TROPONINI, PROBNP in the last 168 hours. Glucose No results for input(s): GLUCAP in the last 168 hours.  Imaging No results found.   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 Considering this, as well as her recent diagnosis of CNS lymphoma with multiple intracranial lesions, they  have decided that they do not wish to pursue any other heroic measures such as CPR/ALCS. Will continue aggressive medical support including mechanical ventilation.  Georgann Housekeeper, AGACNP-BC [facility] [facility] Pager [phone] or [phone]  Attending Note:  I have examined patient, reviewed labs, studies and notes. I have discussed the case with Jaclynn Guarneri, and I agree with the data and plans as amended above. Pt intubated emergently due to seizure and then arrest. Underwent extended CPR. On exam she is unresponsive, now hypertensive, hypoxemic despite high fiO2 and PEEP. Need to get an EEG to eval for continued seizure activity, apparently this can't be done until the am. We will continue current support. ? Whether we can d/c her anti-virals, ? Their current role. I have spoken with the patient's husband and son, explained that we are supporting her fully but that she would not survive another code. They understand and agree that she should be no CPR at this point. Orders modified to reflect this.  Independent critical care time is 50 minutes.   Baltazar Apo, MD, PhD 01/02/2015, 11:36 PM [facility] [phone] or if no answer [phone]

## 2015-01-02 NOTE — ED Provider Notes (Signed)
[place]  Department of Emergency Medicine   Code Blue CONSULT NOTE  Chief Complaint: Cardiac arrest/unresponsive   Level V Caveat: Unresponsive  History of present illness: I was contacted by the hospital for a CODE BLUE cardiac arrest upstairs and presented to the patient's bedside.  The patient was found to be having decreased respiratory effort, she was completely obtunded and not following commands, her breathing was insufficient and shallow, her heart rate was approximately 100 bpm with a very weak pulse, she was hypoxic and requiring supplemental oxygen. The patient was initially observed with assisted ventilations that she had just had a tonic-clonic seizure, history of seizures, history of brain mass with currently getting chemotherapy. When the patient continued to decline the decision was made to intubate, she was given etomidate, intubation was successful, unfortunately she became bradycardic shortly thereafter and lost a pulse requiring CPR. I directed CPR during the resuscitation, 3 mg of epinephrine were given in 1 mg doses, she had successful return of spontaneous circulation.  ROS: Unable to obtain, Level V caveat  Scheduled Meds: . antiseptic oral rinse  7 mL Mouth Rinse BID  . dexamethasone  2 mg Oral Q12H  . docusate sodium  100 mg Oral BID  . famciclovir  500 mg Oral Daily  . feeding supplement (RESOURCE BREEZE)  1 Container Oral TID BM  . fentaNYL      . fluconazole  100 mg Oral Daily  . losartan  100 mg Oral Daily   And  . hydrochlorothiazide  12.5 mg Oral Daily  . lactulose  30 g Oral TID  . mometasone-formoterol  2 puff Inhalation BID  . sodium bicarbonate/sodium chloride   Mouth Rinse Q6H  . sodium chloride  1,000 mL Intravenous Once   Continuous Infusions: . sodium chloride 10 mL/hr at 12/20/14 1029  . norepinephrine (LEVOPHED) Adult infusion    . propofol    .  sodium bicarbonate infusion 1/4 NS 1000 mL 75 mL/hr at 01/02/15 0641   PRN  Meds:.acetaminophen **OR** acetaminophen, albuterol, HYDROcodone-acetaminophen, morphine injection, ondansetron **OR** ondansetron (ZOFRAN) IV, promethazine, RESOURCE THICKENUP CLEAR, sodium chloride, sodium chloride, sodium chloride Past Medical History  Diagnosis Date  . Asthma   . Hypercholesteremia   . GERD (gastroesophageal reflux disease)   . Sleep apnea     AHI 116/hr now on BiPAP at 25/30mmHg  . Edema   . HTN (hypertension)   . Allergic rhinitis   . Vitamin B12 deficiency   . RLS (restless legs syndrome)   . Vitamin deficiency   . Obesity   . Aortic stenosis   . Edema     chronic LE edema  . Heart murmur     (mild AV stenosis with no regurg, mild MV sclerosis with mild MR, mod LVH 2012)  . Carotid artery occlusion     <30% bilateral ICA stenosis, no flow in left vertebral artery  . COPD (chronic obstructive pulmonary disease)     per spouse not diagnosed   Past Surgical History  Procedure Laterality Date  . Tubal ligation    . Craniotomy N/A 11/22/2014    Procedure: LEFT BRAIN BIOPSY w/ Curve;  Surgeon: Newman Pies, MD;  Location: [place] [facility];  Service: Neurosurgery;  Laterality: N/A;  brain biopsy with brain lab   History   Social History  . Marital Status: Married    Spouse Name: N/A  . Number of Children: N/A  . Years of Education: N/A   Occupational History  . Not  on file.   Social History Main Topics  . Smoking status: Current Every Day Smoker -- 1.00 packs/day for 42 years    Types: Cigarettes  . Smokeless tobacco: Not on file  . Alcohol Use: No  . Drug Use: No  . Sexual Activity: Not on file   Other Topics Concern  . Not on file   Social History Narrative   Allergies  Allergen Reactions  . Lisinopril     Feels like she is burning up  . Shellfish Allergy Rash    Last set of Vital Signs (not current) Filed Vitals:   01/02/15 2038  BP:   Pulse: 131  Temp:   Resp: 40    Physical Exam  Gen: unresponsive Cardiovascular: pulseless   Resp: Shallow slow breathing. Breath sounds equal bilaterally with bagging  Abd: nondistended  Neuro: GCS 3, unresponsive to pain  HEENT: No blood in posterior pharynx, gag reflex absent  Neck: No crepitus  Musculoskeletal: No deformity  Skin: warm  Procedures  INTUBATION Performed by: Johnna Acosta Required items: required blood products, implants, devices, and special equipment available Patient identity confirmed: provided demographic data and hospital-assigned identification number Time out: Immediately prior to procedure a "time out" was called to verify the correct patient, procedure, equipment, support staff and site/side marked as required. Indications: Apnea  Intubation method: Direct laryngoscopy Preoxygenation: BVM Sedatives: Etomidate  Paralytic: None  Tube Size: 7.5 cuffed Post-procedure assessment: chest rise and ETCO2 monitor Breath sounds: equal and absent over the epigastrium Tube secured by Respiratory Therapy Patient tolerated the procedure well with no immediate complications.   Cardiopulmonary Resuscitation (CPR) Procedure Note  Directed/Performed by: Johnna Acosta I personally directed ancillary staff and/or performed CPR in an effort to regain return of spontaneous circulation and to maintain cardiac, neuro and systemic perfusion.    Medical Decision making  I was at the bedside for approximately 25 minutes, care was transitioned to the hospitalist, Dr. Ernestina Patches, who responded to the code as well and will assist with placement of the patient into an intensive care unit bed. When I left the room the patient had pulses, was receiving some IV fluids for hypotension, had been given fentanyl and Ativan after intubation to assist with sedation as she continued to have some agitation and started to chew on the endotracheal tube.      Noemi Chapel, MD 01/02/15 [phone]

## 2015-01-02 NOTE — Progress Notes (Addendum)
CSW continuing to follow.   CSW reviewed chart and noted that per MD, pt medically stable for discharge.   CSW contacted [facility] and confirmed that facility could accept pt today.   CSW contacted Dr. Antonieta Pert office and spoke with Dr. Antonieta Pert RN and notified that pt has a bed available at [facility] and facility can accept pt today. CSW informed Dr. Antonieta Pert RN that pt will need discharge summary, signed FL2, and hard prescriptions for any scheduled narcotics. Dr. Antonieta Pert RN states that she will notify Dr. Marin Olp.  CSW awaiting determination from Dr. Marin Olp about completion of discharge paperwork in order for CSW to facilitate pt discharge to [facility] today.  CSW to continue to follow.  Addendum 1:15 pm:  CSW continuing to follow.   CSW had not yet heard back from Dr. Antonieta Pert office regarding if pt medically ready for discharge today.  CSW contacted Dr. Antonieta Pert office at 1:15 pm and left message.   CSW updated pt and pt husband at bedside.  CSW updated [facility]  CSW to continue to follow to provide support and assist with pt discharge needs to [facility] when pt medically ready for discharge.   Addendum 3:50 pm:   CSW received return phone call from Dr. Antonieta Pert RN stating that Dr. Marin Olp plans to discharge pt tomorrow morning.   CSW to update pt and pt husband at bedside.   CSW updated [facility] and confirmed that facility can accept pt tomorrow.   Alison Murray, MSW, [employer] [phone]

## 2015-01-02 NOTE — Progress Notes (Signed)
Mrs. Lazenby had a good weekend. There still is little movement overrun the right side.  Roses that she stayed awake most of yesterday.  She's swallowing okay. She is on a dysphagia diet.  She is having bowel movements.  There is no pain. She's having no problem with bowel sores. There is no nausea or vomiting.  Her methotrexate level level is now down to acceptable. We stopped the leucovorin. Her liver tests are normalizing.  On her physical exam, her vital signs are stable. Blood pressure 163/60. Pulse 64. Temperature 97.7. Lungs are clear. Cardiac exam regular rate and rhythm. Abdomen is soft, obese. Abdomen is nondistended. She has good bowel sounds. Oral cavity shows no mucositis. Extremity shows some trace edema in her legs. She has movement in her right toes. I can't get much movement in the right arm. Skin exam shows no rashes. Neurological exam shows the right-sided weakness.  Her labs show a white cell count of 5.4. Hemoglobin 13.4. Platelet count 47. Her SGPT is 194 and SGOT 29. BUN 17 creatinine 0.5. Potassium 4.5.  I think that she now is got over the toxicity from the first cycle chemotherapy for the CNS lymphoma. I think we can now work on getting her to rehabilitation. She apparently has a bed offer at [facility] We will see if this is still available.  Her Keppra level was quite subtherapeutic. I pry would just go and stopped this as she has not had any obvious seizures.  The staff on [address] a done a fantastic job in taking care of her. I am very appreciative for their outstanding and incredibly compassionate care for her.  Sherrlyn Hock 2:32

## 2015-01-02 NOTE — Progress Notes (Unsigned)
64 y/o [place] woman with newly diagnosed primary CNS lymphoma, coded this evening and after a 45 minute effort was stabilized and transferred intubated to the ICU, where however she coded again, and was again stabilized after great effort.  Dr Lamonte Sakai and I have met with her husband and son and discussed the fact that a third episode of CPR would be futile. She is now on maximal support, and tenuous. If she does stabilize and survive the night, we are concerned re possible aspiration, ARDS, and hypoxic brain damage.  The patient's son is an EMT. He and her husband have a good understanding of this very distressing situation. They agree to no further CPR. I will alert Dr Marin Olp in the AM regarding the patient's rapid turn for the worse.  I greatly appreciate the efforts of the code team, hospitalist, and critical care team on behalf of this patient and her family!

## 2015-01-02 NOTE — Progress Notes (Signed)
Speech Language Pathology Treatment: Dysphagia;Cognitive-Linquistic  Patient Details Name: Tanya King MRN: [id number] DOB: Apr 25, 1951 Today's Date: 01/02/2015 Time: 4098-1191 SLP Time Calculation (min) (ACUTE ONLY): 30 min  Assessment / Plan / Recommendation Clinical Impression  Pt sitting slightly reclined in bed, spouse just having fed her with good tolerance per his report.   SLP notes pt to possibly discharge today to SNF, therefore reviewed importance of aspiration precautions and adequate consumption of liquids for hydration.  Spouse reports pt's breathing is better today than in the last few days.    Reviewed Lorenza Chick Protocol program for spouse/pt information if pt determines intake of nectar thick liquids is not desired.  Did not test pt with thin liquids as she continues with dysarthria and mild dyspnea *on 3 liters of oxygen.  Spouse reports pt's intake of foods he provides her is better than consumption of hospital food.    SLP provided pt with communication board for her use when she is too tired to speak.  Reviewed usage with pt benefiting from min assistance.  Requested spouse to lessen number of choices as indicated.     Requested spouse encourage pt to use call bell to call for assistance for pt safety and to foster independence.  Pt located call bell with max verbal/tactile cues and located button with mod I.     She answered "I don't know" twice during the session but when encouraged she responded appropriately to question - stating "icecream" for favorite food.  Patient encouraged to continue to verbally express herself as much as able.    Pt continues to make progress in her speech/swallowing and will benefit from ongoing treatment at SNF.  Thanks for allowing me to help with this most pleasant patient/family.     HPI HPI: Pt is a 64 yo female with recent admit to [facility] with right lower extremity weakness and slurred speech x2-3 weeks.  Pt found to have a basal  ganglia mass on imaging study.  Pt dc'd home over the weekend and returned for brain biopsy to [facility] on Monday 12/08/2014.  Pt with mild bleed s/p brain bx with resultant dysarthria, increased right sided weakness, left hemianopsia and swallow evaluation ordered.  PMH + for smoking, asthma, HTN.  She has been seen for swalloiwng treatment *during previous admit for language as well.    Pertinent Vitals Pain Assessment: No/denies pain  SLP Plan  Continue with current plan of care    Recommendations Diet recommendations: Dysphagia 3 (mechanical soft);Nectar-thick liquid (consider [employer] Protocol as pt has very supportive family to assure pt stays hydrated ) Liquids provided via: Cup;No straw Medication Administration: Whole meds with puree Supervision: Full supervision/cueing for compensatory strategies;Staff to assist with self feeding Compensations: Slow rate;Small sips/bites;Check for pocketing Postural Changes and/or Swallow Maneuvers: Seated upright 90 degrees;Upright 30-60 min after meal              Oral Care Recommendations: Oral care BID;Oral care before and after PO Follow up Recommendations: Skilled Nursing facility Plan: Continue with current plan of care    [place], Tanya King [facility] SLP [phone]

## 2015-01-02 NOTE — Progress Notes (Addendum)
ANTIBIOTIC CONSULT NOTE - INITIAL  Pharmacy Consult for vancomycin/Zosyn Indication: aspiration pneumonia/HCAP  Allergies  Allergen Reactions  . Lisinopril     Feels like she is burning up  . Shellfish Allergy Rash    Patient Measurements: Height: 5\' 6"  (167.6 cm) Weight: 266 lb 8.6 oz (120.901 kg) IBW/kg (Calculated) : 59.3  Vital Signs: Temp: 97.7 F (36.5 C) (03/14 1514) Temp Source: Oral (03/14 1514) BP: 122/61 mmHg (03/14 2200) Pulse Rate: 144 (03/14 2200) Intake/Output from previous day: 03/13 0701 - 03/14 0700 In: 4723.8 [P.O.:200; I.V.:4523.8] Out: 3225 [Urine:3225] Intake/Output from this shift: Total I/O In: 2.8 [I.V.:2.8] Out: -   Labs:  Recent Labs  12/31/14 0500 01/01/15 0500 01/02/15 0500  WBC 11.1* 7.1 5.4  HGB 13.9 13.1 13.4  PLT 40* 43* 47*  CREATININE 0.61 0.50 0.50   Estimated Creatinine Clearance: 97.8 mL/min (by C-G formula based on Cr of 0.5). No results for input(s): VANCOTROUGH, VANCOPEAK, VANCORANDOM, GENTTROUGH, GENTPEAK, GENTRANDOM, TOBRATROUGH, TOBRAPEAK, TOBRARND, AMIKACINPEAK, AMIKACINTROU, AMIKACIN in the last 72 hours.   Microbiology: Recent Results (from the past 720 hour(s))  Urine culture     Status: None   Collection Time: 12/07/14  8:30 PM  Result Value Ref Range Status   Specimen Description URINE, CLEAN CATCH  Final   Special Requests NONE  Final   Colony Count   Final    >=100,000 COLONIES/ML Performed at [facility]    Culture   Final    ESCHERICHIA COLI Performed at [facility]    Report Status 12/10/2014 FINAL  Final   Organism ID, Bacteria ESCHERICHIA COLI  Final      Susceptibility   Escherichia coli - MIC*    AMPICILLIN <=2 SENSITIVE Sensitive     CEFAZOLIN <=4 SENSITIVE Sensitive     CEFTRIAXONE <=1 SENSITIVE Sensitive     CIPROFLOXACIN <=0.25 SENSITIVE Sensitive     GENTAMICIN <=1 SENSITIVE Sensitive     LEVOFLOXACIN <=0.12 SENSITIVE Sensitive     NITROFURANTOIN <=16 SENSITIVE  Sensitive     TOBRAMYCIN <=1 SENSITIVE Sensitive     TRIMETH/SULFA <=20 SENSITIVE Sensitive     PIP/TAZO <=4 SENSITIVE Sensitive     * ESCHERICHIA COLI  MRSA PCR Screening     Status: None   Collection Time: 12/15/2014  9:20 PM  Result Value Ref Range Status   MRSA by PCR NEGATIVE NEGATIVE Final    Comment:        The GeneXpert MRSA Assay (FDA approved for NASAL specimens only), is one component of a comprehensive MRSA colonization surveillance program. It is not intended to diagnose MRSA infection nor to guide or monitor treatment for MRSA infections.   Surgical pcr screen     Status: None   Collection Time: 12/19/14  1:07 AM  Result Value Ref Range Status   MRSA, PCR NEGATIVE NEGATIVE Final   Staphylococcus aureus NEGATIVE NEGATIVE Final    Comment:        The Xpert SA Assay (FDA approved for NASAL specimens in patients over 47 years of age), is one component of a comprehensive surveillance program.  Test performance has been validated by [facility] for patients greater than or equal to 75 year old. It is not intended to diagnose infection nor to guide or monitor treatment.   Culture, Urine     Status: None   Collection Time: 12/22/14 10:00 AM  Result Value Ref Range Status   Specimen Description URINE, CATHETERIZED  Final   Special Requests Normal  Final   Colony Count   Final    >=100,000 COLONIES/ML Performed at [facility]    Culture   Final    ESCHERICHIA COLI ENTEROCOCCUS SPECIES Performed at [facility]    Report Status 12/25/2014 FINAL  Final   Organism ID, Bacteria ESCHERICHIA COLI  Final   Organism ID, Bacteria ENTEROCOCCUS SPECIES  Final      Susceptibility   Escherichia coli - MIC*    AMPICILLIN <=2 SENSITIVE Sensitive     CEFAZOLIN <=4 SENSITIVE Sensitive     CEFTRIAXONE <=1 SENSITIVE Sensitive     CIPROFLOXACIN <=0.25 SENSITIVE Sensitive     GENTAMICIN <=1 SENSITIVE Sensitive     LEVOFLOXACIN <=0.12 SENSITIVE Sensitive      NITROFURANTOIN <=16 SENSITIVE Sensitive     TOBRAMYCIN <=1 SENSITIVE Sensitive     TRIMETH/SULFA <=20 SENSITIVE Sensitive     PIP/TAZO <=4 SENSITIVE Sensitive     * ESCHERICHIA COLI   Enterococcus species - MIC*    AMPICILLIN <=2 SENSITIVE Sensitive     LEVOFLOXACIN 1 SENSITIVE Sensitive     NITROFURANTOIN <=16 SENSITIVE Sensitive     VANCOMYCIN 1 SENSITIVE Sensitive     TETRACYCLINE >=16 RESISTANT Resistant     * ENTEROCOCCUS SPECIES  Urine culture     Status: None   Collection Time: 12/30/14 12:00 PM  Result Value Ref Range Status   Specimen Description URINE, CATHETERIZED  Final   Special Requests Immunocompromised  Final   Colony Count   Final    >=100,000 COLONIES/ML Performed at [facility]    Culture   Final    Multiple bacterial morphotypes present, none predominant. Suggest appropriate recollection if clinically indicated. Performed at [facility]    Report Status 12/31/2014 FINAL  Final    Medical History: Past Medical History  Diagnosis Date  . Asthma   . Hypercholesteremia   . GERD (gastroesophageal reflux disease)   . Sleep apnea     AHI 116/hr now on BiPAP at 25/62mmHg  . Edema   . HTN (hypertension)   . Allergic rhinitis   . Vitamin B12 deficiency   . RLS (restless legs syndrome)   . Vitamin deficiency   . Obesity   . Aortic stenosis   . Edema     chronic LE edema  . Heart murmur     (mild AV stenosis with no regurg, mild MV sclerosis with mild MR, mod LVH 2012)  . Carotid artery occlusion     <30% bilateral ICA stenosis, no flow in left vertebral artery  . COPD (chronic obstructive pulmonary disease)     per spouse not diagnosed    Medications:  Scheduled:  . antiseptic oral rinse  7 mL Mouth Rinse BID  . dexamethasone  2 mg Oral Q12H  . docusate sodium  100 mg Oral BID  . famciclovir  500 mg Oral Daily  . feeding supplement (RESOURCE BREEZE)  1 Container Oral TID BM  . fentaNYL      . fentaNYL  50 mcg Intravenous Once   . fluconazole  100 mg Oral Daily  . levETIRAcetam  500 mg Intravenous Q12H  . midazolam  1 mg Intravenous Once  . mometasone-formoterol  2 puff Inhalation BID  . piperacillin-tazobactam  3.375 g Intravenous Once  . piperacillin-tazobactam (ZOSYN)  IV  3.375 g Intravenous 3 times per day  . sodium bicarbonate/sodium chloride   Mouth Rinse Q6H  . vancomycin  2,500 mg Intravenous Once   Infusions:  .  sodium chloride 10 mL/hr at 12/20/14 1029  . fentaNYL infusion INTRAVENOUS 50 mcg/hr (01/02/15 2226)  . midazolam (VERSED) infusion 1 mg/hr (01/02/15 2227)  . norepinephrine (LEVOPHED) Adult infusion 14 mcg/min (Jan 16, 2015 0004)  .  sodium bicarbonate  infusion 1000 mL 100 mL/hr at 01/16/15 0004   PRN: sodium chloride, acetaminophen **OR** acetaminophen, albuterol, fentaNYL, midazolam, ondansetron **OR** [DISCONTINUED] ondansetron (ZOFRAN) IV, promethazine, sodium chloride, sodium chloride, sodium chloride Anti-infectives    Start     Dose/Rate Route Frequency Ordered Stop   01-16-2015 0600  piperacillin-tazobactam (ZOSYN) IVPB 3.375 g     3.375 g 12.5 mL/hr over 240 Minutes Intravenous 3 times per day 01/02/15 2210     01/02/15 2330  vancomycin (VANCOCIN) 2,500 mg in sodium chloride 0.9 % 500 mL IVPB     2,500 mg 250 mL/hr over 120 Minutes Intravenous  Once 01/02/15 2210     01/02/15 2300  piperacillin-tazobactam (ZOSYN) IVPB 3.375 g     3.375 g 100 mL/hr over 30 Minutes Intravenous  Once 01/02/15 2210     12/19/14 1000  fluconazole (DIFLUCAN) tablet 100 mg     100 mg Oral Daily 12/19/14 0726     12/18/14 1000  famciclovir (FAMVIR) tablet 500 mg     500 mg Oral Daily 12/18/14 0830     12/18/14 0900  fluconazole (DIFLUCAN) IVPB 200 mg  Status:  Discontinued     200 mg 100 mL/hr over 60 Minutes Intravenous Every 24 hours 12/18/14 0830 12/19/14 0726   12/16/14 0930  ceFAZolin (ANCEF) IVPB 2 g/50 mL premix    Comments:  Hang ON CALL to xray today   2 g 100 mL/hr over 30 Minutes Intravenous  On call 12/16/14 0902 12/17/14 0930   12/16/14 0915  ceFAZolin (ANCEF) IVPB 2 g/50 mL premix  Status:  Discontinued    Comments:  Hang ON CALL to xray today   2 g 100 mL/hr over 30 Minutes Intravenous  Once 12/16/14 0902 12/16/14 0902   11/23/2014 2300  ceFAZolin (ANCEF) IVPB 2 g/50 mL premix     2 g 100 mL/hr over 30 Minutes Intravenous Every 8 hours 11/21/2014 1937 12/13/14 0643   11/22/2014 1751  bacitracin 50,000 Units in sodium chloride irrigation 0.9 % 500 mL irrigation  Status:  Discontinued       As needed 11/23/2014 1752 11/22/2014 1834      Assessment 64 yo obese female who presented 2/22 to [facility] for brain biopsy after recent discovery of tumor. Had small hemorrhage after biopsy and remained inpatient for some time. Also had R sided hemiparesis. 3/14 suffered cardiac arrest 2/2 apparent seizure induced aspiration. Transferred to ICU, PCCM consulted.  To begin Vancomycin/Zosyn per pharmacy for aspiration PNA and to r/o possible HCAP.  Noted to have loss of palpable pulse with subsequent code blue called on 4 occasions during the evening.  3/14 >> Vancomycin >> 3/14 >> Zosyn >>   2/28 >> Famvir PO (MD) >> 2/29 >> Diflucan PO (MD) >>  Tmax: AF WBCs: wnl Renal: Last SCr was 0.5 with CrCl 99  3/3 urine: >100k E. Coli, pan-sensitive 3/11 urine: >100k colonies, none predominant 3/14 blood: sent 3/14 sputum:   Goal of Therapy:   Vancomycin trough level 15-20 mcg/ml   Eradication of infection  Appropriate antibiotic dosing for indication and renal function  Plan:   Vancomycin 2500 mg IV x1.  Despite currently excellent renal function based on SCr, I cannot imagine this will persist tomorrow given cardiac arrest x4.  Will load this obese patient with vancomycin, but will NOT give any additional doses of vancomycin until the renal implications of tonight's events are more evident.  Can consider following SCr/UOP closely, or checking a random VT sometime tomorrow as clinical course  dictates.  Zosyn 3.375 g IV x1 over 30 minutes to begin at 2300, then 3.375 g IV q8hr given over 4 hr infusion, to begin at 0600 tomorrow.  Given likely source of aspiration as witnessed seizure, would also follow renal function closely and dose-reduce Zosyn or consider a non-beta lactam alternative if seizures persist  Follow clinical course, culture results as available  Follow for de-escalation of antibiotics and LOT   Reuel Boom, PharmD Pager: [phone] 01/02/2015, 11:50 PM

## 2015-01-02 NOTE — Progress Notes (Signed)
NUTRITION FOLLOW-UP  INTERVENTION: Provide Resource Breeze po TID, each supplement provides 250 kcal and 9 grams of protein Provide Magic cup TID with meals, each supplement provides 290 kcal and 9 grams of protein Encourage PO intake RD to continue to monitor PO intake  NUTRITION DIAGNOSIS: Inadequate oral intake related to poor appetite as evidenced by PO intake <50%, ongoing.  Goal: Pt to meet >/= 90% of their estimated nutrition needs, unmet   Monitor:  PO and supplemental intake, weight, labs, I/O's  ASSESSMENT: 64 y.o. female with known history of hypertension, hyperlipidemia, probable COPD, tobacco abuse presents to the ER because of increasing difficulty walking due to balance issues and also patient's family found the patient has been having some weakness on the right and lower extremities. Patient also was found to have some slurred speech. Patient's symptoms have been ongoing for last 2-3 weeks.  Pt reports poor appetite. PO intake: 15-40% Pt is now willing to try nutritional supplements, pt would like to try [employer]. Encouraged pt to at least sip on supplements.  Pt reports liking ice cream, RD to order Magic cups on meal trays. Pt was started on Marinol for appetite stimulation, pt reports not feeling an increase in appetite yet.  Labs reviewed: Elevated BUN  Height: Ht Readings from Last 1 Encounters:  11/29/2014 5\' 6"  (1.676 m)    Weight: Wt Readings from Last 1 Encounters:  12/01/2014 266 lb 8.6 oz (120.901 kg)    BMI:  Body mass index is 43.04 kg/(m^2).  Estimated Nutritional Needs: Kcal: 1800-2000 Protein: 85-95g Fluid: 1.8L/day  Skin: closed incision on back and head  Diet Order: Diet - low sodium heart healthy DIET DYS 3  EDUCATION NEEDS: -No education needs identified at this time   Intake/Output Summary (Last 24 hours) at 01/02/15 1124 Last data filed at 01/02/15 1004  Gross per 24 hour  Intake 5988.75 ml  Output   3425 ml  Net  2563.75 ml    Last BM: 3/13  Labs:   Recent Labs Lab 12/31/14 0500 01/01/15 0500 01/02/15 0500  NA 137 137 138  K 4.2 4.2 4.5  CL 99 101 100  CO2 29 29 30   BUN 18 18 17   CREATININE 0.61 0.50 0.50  CALCIUM 8.3* 8.3* 8.6  GLUCOSE 121* 129* 125*    CBG (last 3)  No results for input(s): GLUCAP in the last 72 hours.  Scheduled Meds: . antiseptic oral rinse  7 mL Mouth Rinse BID  . dexamethasone  2 mg Oral Q12H  . docusate sodium  100 mg Oral BID  . famciclovir  500 mg Oral Daily  . fluconazole  100 mg Oral Daily  . losartan  100 mg Oral Daily   And  . hydrochlorothiazide  12.5 mg Oral Daily  . lactulose  30 g Oral TID  . mometasone-formoterol  2 puff Inhalation BID  . sodium bicarbonate/sodium chloride   Mouth Rinse Q6H    Continuous Infusions: . sodium chloride 10 mL/hr at 12/20/14 1029  .  sodium bicarbonate infusion 1/4 NS 1000 mL 75 mL/hr at 01/02/15 0641     Clayton Bibles, MS, RD, LDN Pager: [phone] After Hours Pager: [phone]

## 2015-01-02 NOTE — Progress Notes (Signed)
Techs in room cleaning up pt who  Was incontinent of stool. Pt was alert and responding. Pt was rolled to her side and began to groan and have seizure.Pt  Was put in supine position,unable to feel pulse, code called. CPR was initiated and pt. Was intubated. Dr. Sabra Heck giving orders for code. Pt's family were in the hall and were being spoken to by assist. Director. Pt transferred to ICU 1241. Pulse was present before leaving the unit.

## 2015-01-02 NOTE — Progress Notes (Signed)
Paged at 8:56pm.  Arrived at 9:27pm Process of dealing with the code that brought the Chaplain to the hospital had basically been completed.  Nurse said she was not reacting to voice.  Spent time with the family, son and husband at first.  They were aware of what happened and were seeking to accept the rapidity of this decline (on top of a relatively fresh diagnosis of brain tumors 3 weeks ago).  Per son, she was smiling about 2 hours earlier.  Husband and son had come out of room prior to [place] arrival and didn't want to go back.  When patient's sister-in-law (husband's sister) had arrived, Chaplain accompanied her into the room.  It seems the two were close.  Chaplain departed as he saw the three family members bonding over the situation and supporting one another.  This was a much more real and long term response to what was happening and what might occur.  No one spoke of death, but it was in the room.  As they support one another and spend time with one another, they will each help the others to get through this life changing event.  Loann Quill, Chaplain Pager: [phone]

## 2015-01-02 NOTE — Progress Notes (Signed)
Occupational Therapy Treatment Patient Details Name: Tanya King MRN: [id number] DOB: 1951/10/21 Today's Date: 01/02/2015    History of present illness This 64 y.o. femlae admitted 11/21/2014 with AMS, Rt hemiparesis, dyarthria and apraxia.  MRI showed multiple intracranial lesions.  Metastatic worku p done thus far has been negative.  She underwent stereotatic brain biopsy 12/10/2014 with pathology results pending.  PMH includes: aortic stenosis; asthma; HTN   OT comments  Husband present and education provided           Precautions / Restrictions Precautions Precautions: Fall Precaution Comments: dense R hemiplegia Restrictions Weight Bearing Restrictions: No              ADL                                         General ADL Comments: OT session focused on educating pts husband on RUE ROM and positioning.  Pts husband verbalized and demonstrated understanding. Pt very sleepy                Cognition   Behavior During Therapy: Flat affect                                 General Comments      Pertinent Vitals/ Pain       Pain Assessment: No/denies pain  Home Living                                              Plan Discharge plan remains appropriate    Co-evaluation                 End of Session     Activity Tolerance Patient limited by fatigue   Patient Left in bed   Nurse Communication          Time: 0950-1005 OT Time Calculation (min): 15 min  Charges: OT General Charges $OT Visit: 1 Procedure OT Treatments $Self Care/Home Management : 8-22 mins  Sayge Salvato D 01/02/2015, 10:22 AM

## 2015-01-03 ENCOUNTER — Inpatient Hospital Stay (HOSPITAL_COMMUNITY): Payer: 59

## 2015-01-03 ENCOUNTER — Inpatient Hospital Stay (HOSPITAL_COMMUNITY)
Admission: EM | Admit: 2015-01-03 | Discharge: 2015-01-03 | Disposition: A | Discharge status: Home / Self Care | Payer: 59 | Source: Ambulatory Visit | Attending: Pulmonary Disease | Admitting: Pulmonary Disease

## 2015-01-03 ENCOUNTER — Encounter (HOSPITAL_COMMUNITY): Payer: Self-pay

## 2015-01-03 DIAGNOSIS — I469 Cardiac arrest, cause unspecified: Secondary | ICD-10-CM

## 2015-01-03 DIAGNOSIS — J80 Acute respiratory distress syndrome: Secondary | ICD-10-CM | POA: Insufficient documentation

## 2015-01-03 DIAGNOSIS — R579 Shock, unspecified: Secondary | ICD-10-CM | POA: Insufficient documentation

## 2015-01-03 DIAGNOSIS — J690 Pneumonitis due to inhalation of food and vomit: Secondary | ICD-10-CM

## 2015-01-03 DIAGNOSIS — R001 Bradycardia, unspecified: Secondary | ICD-10-CM

## 2015-01-03 DIAGNOSIS — I959 Hypotension, unspecified: Secondary | ICD-10-CM

## 2015-01-03 LAB — TROPONIN I
TROPONIN I: 1.24 ng/mL — AB (ref <0.031)
TROPONIN I: 2.91 ng/mL — AB (ref <0.031)
Troponin I: 5.24 ng/mL (ref <0.031)

## 2015-01-03 LAB — GLUCOSE, CAPILLARY: GLUCOSE-CAPILLARY: 149 mg/dL — AB (ref 70–99)

## 2015-01-03 LAB — COMPREHENSIVE METABOLIC PANEL
ALBUMIN: 2.1 g/dL — AB (ref 3.5–5.2)
ALK PHOS: 90 U/L (ref 39–117)
ALT: 1292 U/L — ABNORMAL HIGH (ref 0–35)
AST: 684 U/L — AB (ref 0–37)
Anion gap: 11 (ref 5–15)
BILIRUBIN TOTAL: 1.5 mg/dL — AB (ref 0.3–1.2)
BUN: 32 mg/dL — ABNORMAL HIGH (ref 6–23)
CO2: 26 mmol/L (ref 19–32)
CREATININE: 1.5 mg/dL — AB (ref 0.50–1.10)
Calcium: 7.1 mg/dL — ABNORMAL LOW (ref 8.4–10.5)
Chloride: 102 mmol/L (ref 96–112)
GFR calc Af Amer: 42 mL/min — ABNORMAL LOW (ref >90)
GFR, EST NON AFRICAN AMERICAN: 36 mL/min — AB (ref >90)
Glucose, Bld: 184 mg/dL — ABNORMAL HIGH (ref 70–99)
Potassium: 4.4 mmol/L (ref 3.5–5.1)
Sodium: 139 mmol/L (ref 135–145)
Total Protein: 4.6 g/dL — ABNORMAL LOW (ref 6.0–8.3)

## 2015-01-03 LAB — BLOOD GAS, ARTERIAL
Acid-base deficit: 5.2 mmol/L — ABNORMAL HIGH (ref 0.0–2.0)
Acid-base deficit: 1 mmol/L (ref 0.0–2.0)
Acid-base deficit: 8.6 mmol/L — ABNORMAL HIGH (ref 0.0–2.0)
BICARBONATE: 23.3 meq/L (ref 20.0–24.0)
Bicarbonate: 25.5 mEq/L — ABNORMAL HIGH (ref 20.0–24.0)
Bicarbonate: 21.3 mEq/L (ref 20.0–24.0)
DRAWN BY: 308601
Drawn by: 308601
Drawn by: 295031
FIO2: 1 %
FIO2: 1 %
FIO2: 1 %
LHR: 30 {breaths}/min
LHR: 35 {breaths}/min
MECHVT: 480 mL
MECHVT: 420 mL
O2 SAT: 89.8 %
O2 Saturation: 91.2 %
O2 Saturation: 91.9 %
PCO2 ART: 60.2 mmHg — AB (ref 35.0–45.0)
PCO2 ART: 64.3 mmHg — AB (ref 35.0–45.0)
PEEP/CPAP: 20 cmH2O
PEEP/CPAP: 8 cmH2O
PEEP: 12 cmH2O
PH ART: 7.31 — AB (ref 7.350–7.450)
PO2 ART: 75.9 mmHg — AB (ref 80.0–100.0)
Patient temperature: 37
Patient temperature: 37
Patient temperature: 98.6
RATE: 35 resp/min
TCO2: 21.6 mmol/L (ref 0–100)
TCO2: 23.1 mmol/L (ref 0–100)
TCO2: 20.1 mmol/L (ref 0–100)
VT: 550 mL
pCO2 arterial: 52.1 mmHg — ABNORMAL HIGH (ref 35.0–45.0)
pH, Arterial: 7.212 — ABNORMAL LOW (ref 7.350–7.450)
pH, Arterial: 7.147 — CL (ref 7.350–7.450)
pO2, Arterial: 72.2 mmHg — ABNORMAL LOW (ref 80.0–100.0)
pO2, Arterial: 67.1 mmHg — ABNORMAL LOW (ref 80.0–100.0)

## 2015-01-03 LAB — BASIC METABOLIC PANEL
Anion gap: 9 (ref 5–15)
BUN: 26 mg/dL — AB (ref 6–23)
CALCIUM: 7.1 mg/dL — AB (ref 8.4–10.5)
CHLORIDE: 104 mmol/L (ref 96–112)
CO2: 24 mmol/L (ref 19–32)
CREATININE: 1.14 mg/dL — AB (ref 0.50–1.10)
GFR calc Af Amer: 59 mL/min — ABNORMAL LOW (ref >90)
GFR calc non Af Amer: 51 mL/min — ABNORMAL LOW (ref >90)
GLUCOSE: 304 mg/dL — AB (ref 70–99)
Potassium: 4.6 mmol/L (ref 3.5–5.1)
Sodium: 137 mmol/L (ref 135–145)

## 2015-01-03 LAB — CBC
HEMATOCRIT: 42 % (ref 36.0–46.0)
HEMOGLOBIN: 13.7 g/dL (ref 12.0–15.0)
MCH: 31.1 pg (ref 26.0–34.0)
MCHC: 32.6 g/dL (ref 30.0–36.0)
MCV: 95.5 fL (ref 78.0–100.0)
PLATELETS: 38 10*3/uL — AB (ref 150–400)
RBC: 4.4 MIL/uL (ref 3.87–5.11)
RDW: 14.1 % (ref 11.5–15.5)
WBC: 4.9 10*3/uL (ref 4.0–10.5)

## 2015-01-03 LAB — LACTIC ACID, PLASMA: LACTIC ACID, VENOUS: 4.5 mmol/L — AB (ref 0.5–2.0)

## 2015-01-03 LAB — MAGNESIUM
Magnesium: 2 mg/dL (ref 1.5–2.5)
Magnesium: 1.9 mg/dL (ref 1.5–2.5)

## 2015-01-03 LAB — PHOSPHORUS
PHOSPHORUS: 7.9 mg/dL — AB (ref 2.3–4.6)
Phosphorus: 8 mg/dL — ABNORMAL HIGH (ref 2.3–4.6)

## 2015-01-03 LAB — CORTISOL: CORTISOL PLASMA: 4.4 ug/dL

## 2015-01-03 MED ORDER — VASOPRESSIN 20 UNIT/ML IV SOLN
0.0300 [IU]/min | INTRAVENOUS | Status: DC
  Administered 2015-01-03: 0.03 [IU]/min via INTRAVENOUS
  Filled 2015-01-03: qty 2

## 2015-01-03 MED ORDER — SODIUM CHLORIDE 0.9 % IV BOLUS (SEPSIS)
1000.0000 mL | Freq: Once | INTRAVENOUS | Status: AC
Start: 2015-01-03 — End: 2015-01-03
  Administered 2015-01-03: 1000 mL via INTRAVENOUS

## 2015-01-03 MED ORDER — NOREPINEPHRINE BITARTRATE 1 MG/ML IV SOLN
0.0000 ug/min | INTRAVENOUS | Status: DC
  Administered 2015-01-03: 45 ug/min via INTRAVENOUS
  Filled 2015-01-03 (×2): qty 16

## 2015-01-03 MED ORDER — ACETAMINOPHEN 160 MG/5ML PO SOLN: 650.0000 mg | Freq: Four times a day (QID) | ORAL | Status: DC | PRN

## 2015-01-03 MED ORDER — VANCOMYCIN HCL 10 G IV SOLR: 1250.0000 mg | INTRAVENOUS | Status: DC

## 2015-01-03 MED ORDER — CISATRACURIUM BOLUS VIA INFUSION
0.0500 mg/kg | Freq: Once | INTRAVENOUS | Status: DC
Start: 2015-01-03 — End: 2015-01-03
  Filled 2015-01-03: qty 6

## 2015-01-03 MED ORDER — SODIUM CHLORIDE 0.9 % IV SOLN: INTRAVENOUS | Status: DC | PRN

## 2015-01-03 MED ORDER — SODIUM BICARBONATE 8.4 % IV SOLN
100.0000 meq | Freq: Once | INTRAVENOUS | Status: AC
  Administered 2015-01-03: 100 meq via INTRAVENOUS

## 2015-01-03 MED ORDER — CHLORHEXIDINE GLUCONATE 0.12 % MT SOLN: 15.0000 mL | Freq: Two times a day (BID) | OROMUCOSAL | Status: DC

## 2015-01-03 MED ORDER — SODIUM BICARBONATE 8.4 % IV SOLN
INTRAVENOUS | Status: AC
  Filled 2015-01-03: qty 50

## 2015-01-03 MED ORDER — MIDAZOLAM HCL 2 MG/2ML IJ SOLN
2.0000 mg | Freq: Once | INTRAMUSCULAR | Status: AC | PRN
  Administered 2015-01-02: 2 mg via INTRAVENOUS

## 2015-01-03 MED ORDER — ARTIFICIAL TEARS OP OINT
1.0000 "application " | TOPICAL_OINTMENT | Freq: Three times a day (TID) | OPHTHALMIC | Status: DC
  Administered 2015-01-03: 1 via OPHTHALMIC
  Filled 2015-01-03: qty 3.5

## 2015-01-03 MED ORDER — SODIUM CHLORIDE 0.9 % IV BOLUS (SEPSIS)
500.0000 mL | Freq: Once | INTRAVENOUS | Status: AC
  Administered 2015-01-03: 500 mL via INTRAVENOUS

## 2015-01-03 MED ORDER — SODIUM CHLORIDE 0.9 % IV SOLN
3.0000 ug/kg/min | INTRAVENOUS | Status: DC
  Administered 2015-01-03: 3 ug/kg/min via INTRAVENOUS
  Filled 2015-01-03: qty 20

## 2015-01-03 MED ORDER — CETYLPYRIDINIUM CHLORIDE 0.05 % MT LIQD: 7.0000 mL | Freq: Four times a day (QID) | OROMUCOSAL | Status: DC

## 2015-01-03 MED ORDER — PANTOPRAZOLE SODIUM 40 MG IV SOLR
40.0000 mg | Freq: Every day | INTRAVENOUS | Status: DC
  Administered 2015-01-03: 40 mg via INTRAVENOUS
  Filled 2015-01-03: qty 40

## 2015-01-03 MED ORDER — ALBUTEROL SULFATE (2.5 MG/3ML) 0.083% IN NEBU: 2.5000 mg | INHALATION_SOLUTION | RESPIRATORY_TRACT | Status: DC | PRN

## 2015-01-03 MED ORDER — DEXAMETHASONE SODIUM PHOSPHATE 4 MG/ML IJ SOLN
8.0000 mg | Freq: Two times a day (BID) | INTRAMUSCULAR | Status: DC
  Administered 2015-01-03: 8 mg via INTRAVENOUS
  Filled 2015-01-03: qty 2

## 2015-01-05 LAB — CULTURE, RESPIRATORY W GRAM STAIN

## 2015-01-05 LAB — CULTURE, BLOOD (ROUTINE X 2)

## 2015-01-05 LAB — CULTURE, RESPIRATORY

## 2015-01-06 NOTE — Discharge Summary (Signed)
Tanya King was a 64 y.o. female smoker admitted on 12/17/2014 altered mental status and right sided weakness.  She had an MRI which showed multiple brain lesions.  She had left stereotactic brain biopsy on 12/17/2014.  She was evaluated by oncology, and was being assessed for inpatient rehab.  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  She was transferred from [facility] to [facility] for therapy.  She had port a cath placed by IR.  She also had bone marrow biopsy.  She was started on chemotherapy with methotrexate, vincristine, and rituxan.  She was followed by speech therapy for her dysphagia.  She tolerated initial chemotherapy well.  Unfortunately, on 01/02/15 she appeared to develop seizure and asystole.  She was intubated and cardiac resuscitation was performed.  She eventually had ROSC, but these events were complicated by aspiration pneumonitis with ARDS and septic shock.  She was transferred to ICU.  Her clinical status was d/w her family.  They were in agreement that she would not want to be maintained on machines.  She was made DNR.  She developed asystole, and expired on 2015-01-19.   Final diagnoses: CNS lymphoma Cardiac arrest Seizure Acute respiratory failure Aspiration pneumonitis  ARDS Respiratory acidosis Acute kidney injury Shock liver Elevated troponin due to demand ischemia Lactic acidosis Septic shock Right hemiparesis Dysphagia Thrombocytopenia UTI with E coli and Enterococcus Steroid induced hyperglycemia Hx of HTN Chronic diastolic heart failure Bilateral pulmonary nodules Hx of COPD with asthma Hx of obstructive sleep apnea Tobacco abuse  Chesley Mires, MD [place] 01/06/2015, 10:41 AM

## 2015-01-09 LAB — CULTURE, BLOOD (ROUTINE X 2): Culture: NO GROWTH

## 2015-01-20 NOTE — Progress Notes (Addendum)
[facility] PCCM PROGRESS NOTE  S: Called to bedside by staff RN to evaluate refractory hypoxemia despite deep sedation and 100% fiO2, increased PEEP. ABG was obtained and showed a worsening respiratory acidosis. All this in the setting of presumed aspiration pneumonitis. Also BPs have now been soft, when she was previously hypertensive, even post-arrest.  O: BP 105/67 mmHg  Pulse 126  Temp(Src) 97.7 F (36.5 C) (Oral)  Resp 39  Ht 5\' 9"  (1.753 m)  Wt 119.4 kg (263 lb 3.7 oz)  BMI 38.85 kg/m2  SpO2 91%  General:  Obese female Neuro:  Sedated on vent HEENT:  [place]/AT, PERRL, no JVD noted Cardiovascular:  Tachy, regular Lungs:  Coarse throughout Abdomen:  Soft, non-distended Musculoskeletal:  no acute deformity Skin:  Diaphoresis, otherwise grossly intact   A/P:  ARDS Aspiration pneumonitis - continue ARDS protocol ventilation, however, allow 8cc/kg due to need for increased minute ventilation - Add Nimbex. Initiate BIS and TO4 monitoring - RASS goal: -5. On midazolam and sublimaze gtt's  Septic vs Cardiogenic Shock - insert CVL - CVP monitoring, goal 8-12 - Levophed for MAP goal > 65 - Family maintains no CPR/ACLS  Family updated and agree with course of action. Would like to give her a chance for recovery. If no improvement next 24-48 hours would likely consider transition to comfort.   Critical care time independent of procedures: 45 mins  Georgann Housekeeper, ACNP [facility] Pulmonology/Critical Care Pager [phone] or [phone]

## 2015-01-20 NOTE — Progress Notes (Signed)
PT Cancellation Note  Patient Details Name: Tanya King MRN: [id number] DOB: [birth year]   Cancelled Treatment:     Cardiac arrest yesterday then transferred to ICU.   Nathanial Rancher Feb 02, 2015, 8:55 AM

## 2015-01-20 NOTE — Procedures (Signed)
Central Venous Catheter Insertion Procedure Note Tanya King [id number] July 03, 1951  Procedure: Insertion of Central Venous Catheter Indications: Assessment of intravascular volume, Drug and/or fluid administration and Frequent blood sampling  Procedure Details Consent: Risks of procedure as well as the alternatives and risks of each were explained to the (patient/caregiver).  Consent for procedure obtained. Time Out: Verified patient identification, verified procedure, site/side was marked, verified correct patient position, special equipment/implants available, medications/allergies/relevent history reviewed, required imaging and test results available.  Performed  Maximum sterile technique was used including antiseptics, cap, gloves, gown, hand hygiene, mask and sheet. Skin prep: Chlorhexidine; local anesthetic administered A antimicrobial bonded/coated triple lumen catheter was placed in the left internal jugular vein using the Seldinger technique. Ultrasound guidance used.Yes.   Catheter placed to 22 cm. Blood aspirated via all 3 ports and then flushed x 3. Line sutured x 2 and dressing applied.  Evaluation Blood flow good Complications: No apparent complications Patient did tolerate procedure well. Chest X-ray ordered to verify placement.  CXR: pending.  Georgann Housekeeper, AGACNP-BC [facility] Pulmonology/Critical Care Pager [phone] or [phone]

## 2015-01-20 NOTE — Progress Notes (Signed)
Patient expired. Verified by 2 RNs; Wandra Scot, RN and Buren Kos, RN.

## 2015-01-20 NOTE — Progress Notes (Signed)
SLP Cancellation Note  Patient Details Name: Tanya King MRN: [id number] DOB: 02-Aug-1951   Cancelled treatment:       Reason Eval/Treat Not Completed:  (Note change in medical status and orders discontinued, please reorder if desire.  thanks. )   Janett Labella [place], [facility] SLP [phone]

## 2015-01-20 NOTE — Progress Notes (Signed)
  Echocardiogram 2D Echocardiogram has been performed. Darlina Sicilian M 01/25/15, 8:18 AM

## 2015-01-20 NOTE — Progress Notes (Signed)
Spoke with pt's husband and son.  They understand how sick she is, and that she might not recover.  They are not ready to make decision yet about withdrawing vent support.  We have decide to continue current tx for next 24 hrs and then re-assess.  Chesley Mires, MD [facility] Pulmonary/Critical Care 2015/01/30, 9:21 AM Pager:  [phone] After 3pm call: [phone]

## 2015-01-20 NOTE — Progress Notes (Signed)
Pt oxygen saturation decreasing into th mid 70's low 80's with decreasing blood pressures. MD notified of acute change. Bicarb pushes and 500cc bolus ordered.

## 2015-01-20 NOTE — Progress Notes (Signed)
Fentanyl 2500 mcg in 250cc NS- 130 cc wasted in sink after patient expired. Versed 50 mg in 50cc NS- 7 cc wasted in sink after patient expired.  Wasted with Antoine Poche, RN.

## 2015-01-20 NOTE — Consult Note (Signed)
PULMONARY / CRITICAL CARE MEDICINE   Name: Margaret Cockerill MRN: [id number] DOB: May 24, 1951    ADMISSION DATE:  11/25/2014 CONSULTATION DATE:  01/02/2015  REFERRING MD :  Marin Olp  CHIEF COMPLAINT:  Cardiac arrest  INITIAL PRESENTATION: 64 year old female who presented 2/22 to [facility] for brain biopsy after recent discovery of tumor. Had small hemorrhage after biopsy and remained inpatient for some time. Also had R sided hemiparesis. 3/14 suffered cardiac arrest 2nd to apparent seizure induced aspiration. PCCM to assume care.   STUDIES:  2/18 MRI Brain > Multiple enhancing mass lesions in the brain, largest in left basal ganglia. Vasogenic edema around lesions. Suspicious for CNS lymphoma.  2/18 CT chest/abd/pelvis > Stable 12 x 10 mm lingular and 4 mm RIGHT middle lobe nodules. Bibasilar atelectasis. Question small hepatic cyst.  Bilateral inguinal and tiny umbilical hernias. 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 No new lesions have developed since prior MR. Slight improvement LEFT-to-RIGHT shift following steroid administration.  SIGNIFICANT EVENTS: 2/22 > brain biopsy with some hemorrhage, admitted. R sided hemiplegia.  3/01 > transferred to [facility] for chemo, still very limited movement R side 3/14 > seizure > aspiration > arrest> intubated to ICU > limited code, no CPR.  3/15 > start nimbex  SUBJECTIVE:  Remains on pressors.  VITAL SIGNS: Temp:  [97.7 F (36.5 C)-99.6 F (37.6 C)] 99.6 F (37.6 C) (03/15 0400) Pulse Rate:  [25-145] 105 (03/15 0700) Resp:  [20-43] 37 (03/15 0700) BP: (66-222)/(17-162) 67/44 mmHg (03/15 0435) SpO2:  [84 %-100 %] 95 % (03/15 0700) FiO2 (%):  [100 %] 100 % (03/15 0736) Weight:  [263 lb 3.7 oz (119.4 kg)] 263 lb 3.7 oz (119.4 kg) (03/15 0036) VENTILATOR SETTINGS: Vent Mode:  [-] PRVC FiO2 (%):  [100 %] 100 % Set Rate:  [14 bmp-35 bmp] 35 bmp Vt Set:  [420 mL-550 mL] 550 mL PEEP:  [8 cmH20-20 cmH20] 12  cmH20 Plateau Pressure:  [21 cmH20-24 cmH20] 24 cmH20 INTAKE / OUTPUT:  Intake/Output Summary (Last 24 hours) at January 24, 2015 0824 Last data filed at 01-24-2015 0700  Gross per 24 hour  Intake 4560.89 ml  Output   2400 ml  Net 2160.89 ml    PHYSICAL EXAMINATION: General: ill appearing Neuro: RASS -4 HEENT: ETT in place, Lt frontal wound site clean Cardiovascular: regular, tachycardic Lungs: increased WOB, b/l crackles Abdomen: soft, non tender Musculoskeletal: no edema Skin: no rashes  LABS:  CBC  Recent Labs Lab 01/02/15 0500 01/02/15 2240 2015-01-24 0350  WBC 5.4 5.7 4.9  HGB 13.4 13.8 13.7  HCT 40.2 42.1 42.0  PLT 47* CONSISTENT WITH PREVIOUS RESULT 38*   BMET  Recent Labs Lab 01/02/15 0500 01/02/15 2240 01-24-2015 0350  NA 138 137 139  K 4.5 4.6 4.4  CL 100 104 102  CO2 30 24 26   BUN 17 26* 32*  CREATININE 0.50 1.14* 1.50*  GLUCOSE 125* 304* 184*   Electrolytes  Recent Labs Lab 01/02/15 0500 01/02/15 2240 2015/01/24 0350  CALCIUM 8.6 7.1* 7.1*  MG  --  2.0 1.9  PHOS  --  8.0* 7.9*   Sepsis Markers  Recent Labs Lab 01/02/15 2250  LATICACIDVEN 4.5*   ABG  Recent Labs Lab 01/24/2015 01-24-2015 0355 24-Jan-2015 0700  PHART 7.147* 7.212* 7.310*  PCO2ART 64.3* 60.2* 52.1*  PO2ART 75.9* 72.2* 67.1*   Liver Enzymes  Recent Labs Lab 01/01/15 0500 01/02/15 0500 01/24/15 0350  AST 34 29 684*  ALT 273* 194*  1292*  ALKPHOS 57 57 90  BILITOT 0.8 0.8 1.5*  ALBUMIN 2.5* 2.6* 2.1*   Cardiac Enzymes  Recent Labs Lab 01/02/15 2240 01-15-2015 0350  TROPONINI 1.24* 2.91*   Glucose  Recent Labs Lab 01/02/15 1936  GLUCAP 117*    Imaging Dg Chest 1 View  01/02/2015   CLINICAL DATA:  Endotracheal tube placement and orogastric tube placement. Status post seizure and cardiac arrest. Initial encounter.  EXAM: CHEST  1 VIEW  COMPARISON:  Chest radiograph performed 12/07/2014, and CT of the chest performed 12/08/2014  FINDINGS: The patient's endotracheal  tube is seen ending 3-4 cm above the carina. A right-sided chest port is noted ending about the mid SVC. An enteric tube is noted extending below the diaphragm.  The lungs are well-aerated. Patchy right-sided airspace opacification is concerning for right-sided pneumonia. More mild left-sided airspace opacities are seen. Asymmetric interstitial edema is considered less likely. No definite pleural effusion or pneumothorax is seen.  The patient's lingular nodule measures slightly larger than on prior studies, but demonstrated no abnormal uptake on prior PET/CT performed 09/21/2014. The right middle lobe pulmonary nodule is not well characterized on radiograph.  The cardiomediastinal silhouette is borderline normal in size. External pacing pads are noted. No acute osseous abnormalities are seen. There is no evidence of focal opacification, pleural effusion or pneumothorax.  The cardiomediastinal silhouette is within normal limits. No acute osseous abnormalities are seen.  IMPRESSION: 1. Endotracheal tube seen ending 3-4 cm above the carina. 2. Patchy right-sided airspace opacification is concerning for right-sided pneumonia. More mild left-sided airspace opacity seen. Asymmetric interstitial edema is considered less likely.   Electronically Signed   By: Garald Balding M.D.   On: 01/02/2015 20:48     ASSESSMENT / PLAN:  PULMONARY ETT 3/14 > A: Acute hypoxemic respiratory failure with ARDS 2nd to aspiration PNA. Respiratory acidosis. P:   Full vent support F/u CXR, ABG Add nimbex 3/15  CARDIOVASCULAR Port a cath R chest > Rt radial aline 3/15 >  A:  Cardiac arrest, suspect hypoxemic in origin. Hx of hypertension. Septic shock. P:  F/u Echo Pressors for goal MAP > 65  RENAL A:   AKI. P:   Follow Bmet, ABG Continue HCO3 gtt for now  GASTROINTESTINAL A:   Nutrition. 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 Thrombocytopenia. P:  F/u  CBC SCD's  INFECTIOUS A:   Aspiration pneumonitis. UTI. P:   Day 2 vancomycin, zosyn Continue Famvir Continue fluconazole  Urine Cx 3/3 >  Ecoli pan sensitive, enterococcus tetracycline resistant  Blood 3/14 >> Sputum 3/15 >>  ENDOCRINE A:  Steroid induced hyperglycemia. 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 Seizures. P:   RASS goal: -4 Continue decadron, keppra  Goals of care >> no CPR, no defibrillation  CC time 40 minutes.  Chesley Mires, MD [facility] Pulmonary/Critical Care 01-15-15, 8:32 AM Pager:  [phone] After 3pm call: [phone]

## 2015-01-20 NOTE — Procedures (Signed)
History: 64 yo F s/p biopsy lymphoma and PEA arrest  Sedation: fentanyl/versed  Technique: This is a 17 channel routine scalp EEG performed at the bedside with bipolar and monopolar montages arranged in accordance to the international 10/20 system of electrode placement. One channel was dedicated to EKG recording.    Background: There is an asymmetry to the background with faster frequencies being better defined on the right than left. Even so, the predominant background is theta range with the PDR achieving a frequency of 6 Hz. No seizure was seen.   Photic stimulation: Physiologic driving is not performed  EEG Abnormalities: 1) Attenuation of faster frequencies on the left 2) Irregular slow activity.   Clinical Interpretation: This EEG is consistent with a focal left hemispheric dysfunction in the setting of a generalized cerebral dysfunction. There was no seizure or seizure predisposition recorded on this study.   Roland Rack, MD [facility] [phone]  If 7pm- 7am, please page neurology on call as listed in [place].

## 2015-01-20 NOTE — Progress Notes (Signed)
CRITICAL VALUE ALERT  Critical value received:  Troponin I: 1.24  Date of notification: 01/28/2015  Time of notification:  0040  Critical value read back:Yes.    Nurse who received alert:  Javier Glazier  MD notified (1st page):  Hoffman   Time of first page:  0040  MD notified (2nd page):  Time of second page:  Responding MD:  Heber [place]  Time MD responded:  0712

## 2015-01-20 NOTE — Progress Notes (Signed)
CRITICAL VALUE ALERT  Critical value received:  Troponin I: 2.91  Date of notification:  Feb 01, 2015  Time of notification: 0505  Critical value read back:Yes.    Nurse who received alert:  Javier Glazier  MD notified (1st page):  Deterding  Time of first page:  0535  MD notified (2nd page):  Time of second page:  Responding MD:  Deterding  Time MD responded:  [phone]

## 2015-01-20 NOTE — Progress Notes (Signed)
I am absolutely stunned as to what happened yesterday. We were trying to discharge her this morning to rehabilitation. I could not get the paperwork done yesterday so she can go yesterday. It is apparent that God did not want her to leave the hospital yesterday.  She had a cardiac arrest last night. She was coded for over 30 minutes. There was questionable seizure activity. She now is in the ICU. She is not responsive. She is on a ventilator.  She looked really good yesterday. She had a good weekend. Her labs were good. Her liver tests were improving. Her LFTs are now very high which I suspect is probably from hypoperfusion and hypotension.  I spoke to the family. They are very attentive. They have really been there for her. She has done all that we have asked. She had the right-sided weakness after the brain biopsy 3 weeks ago. She got through her chemotherapy really without much in the way of toxicity.  She is not stable to do any kind of brain scan. I wouldn't think that she would have progressive disease or blood into the brain for one of her lesions. Typically, lymphoma does not hemorrhage.  She is on pressor support right now. She is on antibiotic support. They will paralyze her. An EEG will be done.  When speaking to the family, they did not want her to go through a lot of suffering. They did not do not want any additional life-support measures to be taken. They do not want her resuscitated if she were to have another cardiac arrest. I certainly understand this. I don't think she would want to be a "vegetable" and have no quality of life. I told her family that next couple days will be very important.  Her labs today show her BUN and creatinine to be 32 and 1.5. Calcium 7.1 with an albumin of 2.1. Her liver function tests are quite elevated, again I think from hypoperfusion.  With her physical exam, she is markedly hypotensive. Blood pressure 67/44. Temperature is 99.6. Heart rate is 40.  As  hard as this is for me, I just do not think that she will survive this. She has a very strong faith. Her family has a very strong faith. Again, everything was going so well and I had no idea of what was to happen. Again, I was not able to discharge yesterday and I am glad that she was not discharged. God obviously want her in the hospital for this event. I think He will take her home soon. I think with her hypotension and bradycardia, I just think that she is just not going to be able to get better. Her family is very understanding. They're very appreciative and grateful for the outstanding care that she has received.  I very much appreciate the wonderful care that she is getting in the ICU. I will be more happy to transfer her to the critical care team while she is in ICU. I know they will be able to help her out much more than I would be able to.  Lum Keas  2 Timothy 4:17-18

## 2015-01-20 NOTE — Progress Notes (Signed)
CRITICAL VALUE ALERT  Critical value received: Lactic acid 4.5  Date of notification:  01-22-15  Time of notification:  0003  Critical value read back:Yes.    Nurse who received alert: Javier Glazier  MD notified (1st page):  Heber Arion, P  Time of first page:  0003  MD notified (2nd page):  Time of second page:  Responding MD:  Jaclynn Guarneri  Time MD responded:  [phone]

## 2015-01-20 NOTE — Procedures (Signed)
Arterial Catheter Insertion Procedure Note Tanya King [id number] [birth year]  Procedure: Insertion of Arterial Catheter  Indications: Blood pressure monitoring  Procedure Details Consent: Unable to obtain consent because of altered level of consciousness. Time Out: Verified patient identification, verified procedure, site/side was marked, verified correct patient position, special equipment/implants available, medications/allergies/relevent history reviewed, required imaging and test results available.  Performed  Maximum sterile technique was used including antiseptics, cap, gloves, gown, hand hygiene, mask and sheet. Skin prep: Chlorhexidine; local anesthetic administered 20 gauge catheter was inserted into right radial artery using the Seldinger technique.  Evaluation Blood flow good; BP tracing good. Complications: No apparent complications.   [name] 2015-01-25

## 2015-01-20 NOTE — Progress Notes (Signed)
Offsite EEG completed at [facility]. Results pending. 

## 2015-01-20 NOTE — Progress Notes (Signed)
Referral from nursing reporting pt death.  Chaplain present for support around pt death, grief.    [place], [place]

## 2015-01-20 NOTE — Progress Notes (Signed)
eLink Physician-Brief Progress Note Patient Name: Tanya King DOB: April 06, 1951 MRN: [id number]   Date of Service  Jan 20, 2015  HPI/Events of Note  Best Practice  eICU Interventions  Protonix while intubated for stress ulcer propy     Intervention Category Intermediate Interventions: Best-practice therapies (e.g. DVT, beta blocker, etc.)  [name] 2015/01/20, 12:09 AM

## 2015-01-20 NOTE — Progress Notes (Signed)
ANTIBIOTIC CONSULT NOTE - Follow Up  Pharmacy Consult for vancomycin/Zosyn Indication: aspiration pneumonia/HCAP  Allergies  Allergen Reactions  . Lisinopril     Feels like she is burning up  . Shellfish Allergy Rash    Patient Measurements: Height: 5\' 6"  (167.6 cm) Weight: 263 lb 3.7 oz (119.4 kg) IBW/kg (Calculated) : 59.3  Vital Signs: Temp: 99.6 F (37.6 C) (03/15 0400) Temp Source: Axillary (03/15 0400) BP: 67/44 mmHg (03/15 0435) Pulse Rate: 105 (03/15 0700) Intake/Output from previous day: 03/14 0701 - 03/15 0700 In: 4560.9 [P.O.:480; I.V.:2730.9; IV Piggyback:1200] Out: 2400 [Urine:2400] Intake/Output from this shift:    Labs:  Recent Labs  01/02/15 0500 01/02/15 2240 January 16, 2015 0350  WBC 5.4 5.7 4.9  HGB 13.4 13.8 13.7  PLT 47* CONSISTENT WITH PREVIOUS RESULT 38*  CREATININE 0.50 1.14* 1.50*   Estimated Creatinine Clearance: 51.8 mL/min (by C-G formula based on Cr of 1.5). No results for input(s): VANCOTROUGH, VANCOPEAK, VANCORANDOM, GENTTROUGH, GENTPEAK, GENTRANDOM, TOBRATROUGH, TOBRAPEAK, TOBRARND, AMIKACINPEAK, AMIKACINTROU, AMIKACIN in the last 72 hours.    Assessment 64 yo obese female who presented 2/22 to [facility] for brain biopsy after recent discovery of tumor. Had small hemorrhage after biopsy and remained inpatient for some time. Also had R sided hemiparesis. 3/14 suffered cardiac arrest 2/2 apparent seizure induced aspiration. Transferred to ICU, PCCM consulted.  To begin Vancomycin/Zosyn per pharmacy for aspiration PNA and to r/o possible HCAP.  Noted to have loss of palpable pulse with subsequent code blue called on 4 occasions during the evening.  3/15 >> Vancomycin >> 3/15 >> Zosyn >>  2/28 >> Famvir PO (MD) >> 3/15 2/29 >> Diflucan PO (MD) >> 3/15  Tmax: 99.6 WBCs: wnl Renal: SCr 1.5, CrCl~73 ml/miin (CG), ~44 ml/min (normalized)  3/3 urine: >100k E. Coli, pan-sensitive 3/11 urine: >100k colonies, none predominant 3/14 blood: sent 3/14  sputum:  Patient received vancomycin 2500 mg x1 and was started on Zosyn 3.375g IV q8h (4 hour infusion time) early this AM.    Goal of Therapy:   Vancomycin trough level 15-20 mcg/ml   Eradication of infection  Appropriate antibiotic dosing for indication and renal function  Plan:  1.  Vancomycin 1250 mg IV q24h.  Next dose due 0300 3/16. 2.  Continue Zosyn 3.375g IV q8h (4 hour infusion time).  3.  F/u SCr. Concerned that may continue to worsen following cardiac arrest.    Hershal Coria, PharmD, BCPS Pager: [phone] 01/16/2015 8:04 AM

## 2015-01-20 NOTE — Progress Notes (Addendum)
CSW continuing to follow.   CSW reviewed chart and noted that pt now in ICU on full vent support following cardiac arrest yesterday evening.   CSW met with pt husband and pt son in ICU waiting room. Pt husband tearful and CSW provided emotional support.   Pt husband expressed that they understand how sick the pt is, but were hopeful that pt would be able to get to [facility] to start rehab. Pt husband and pt son recognize pt poor prognosis. CSW provided supportive counseling as pt husband discussed that he was not ready to make decision about withdrawing care today, but states "I will have to wake up in the morning and make a decision."   Pt husband grateful for the care that pt has been receiving in the hospital and aware that CSW available for continued support.   CSW contacted [facility] to notify of pt change in condition.   CSW to continue to follow.   Alison Murray, MSW, [place] [employer] [phone]

## 2015-01-20 DEATH — deceased

## 2015-09-18 ENCOUNTER — Ambulatory Visit: Payer: 59 | Admitting: Surgery

## 2015-09-18 ENCOUNTER — Other Ambulatory Visit (HOSPITAL_COMMUNITY): Payer: 59

## 2016-01-05 ENCOUNTER — Other Ambulatory Visit: Payer: Self-pay | Admitting: Nurse Practitioner

## 2016-02-26 IMAGING — CT CT BIOPSY
1 of 2 series · 13 of 32 positions shown, 19 images · non-contrast
Comparison: none

CLINICAL DATA: Lymphoma

[Series 2: i-spiral 5.0 b40f · axial · 0.97mm/px · z∈[+1234,+1311]mm · 13 of 26 slices shown, 19 images]
[im 2/26  soft-tissue]
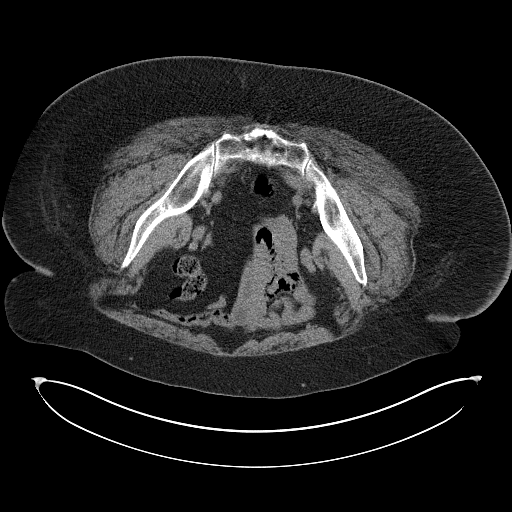
[im 2/26  bone]
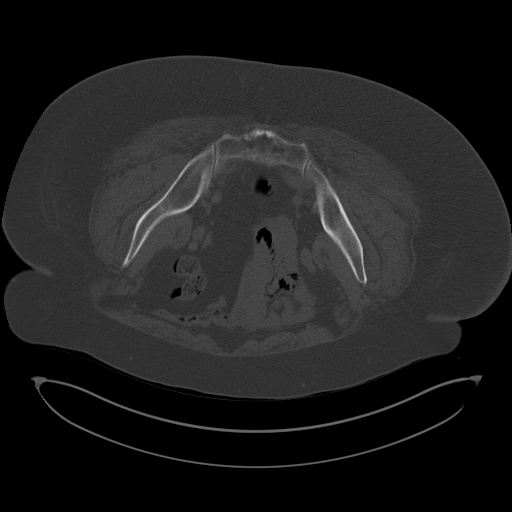
[im 4/26  soft-tissue]
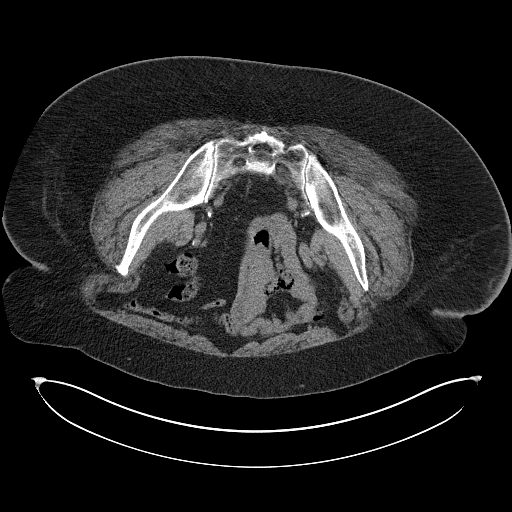
[im 6/26  soft-tissue]
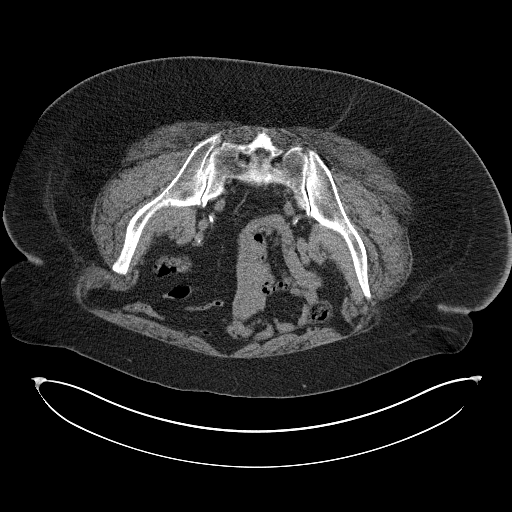
[im 7/26  soft-tissue]
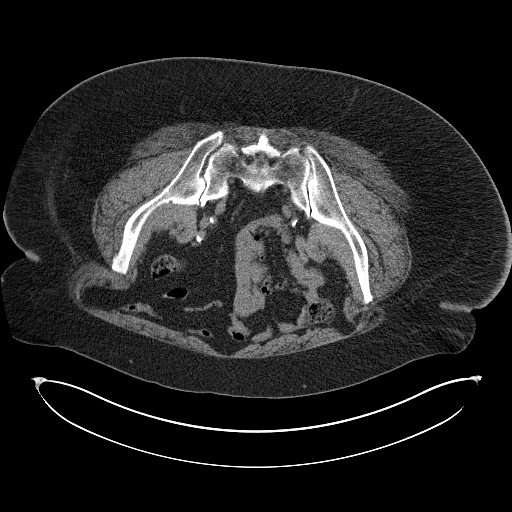
[im 9/26  soft-tissue]
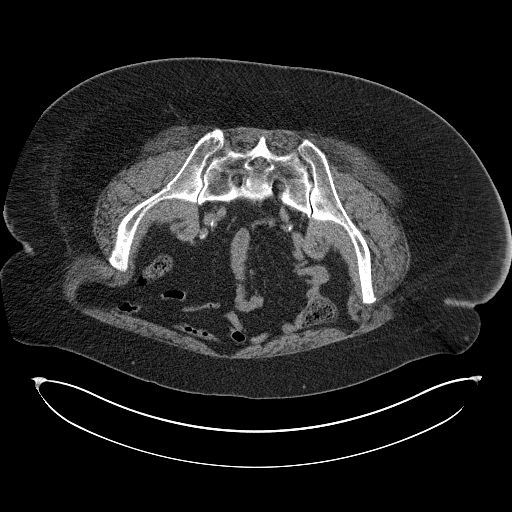
[im 11/26  soft-tissue]
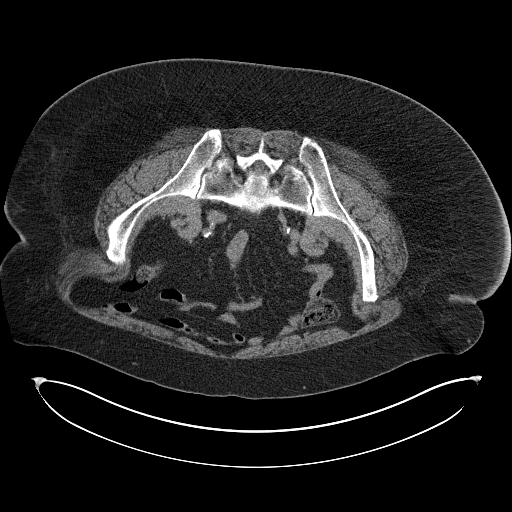
[im 14/26  soft-tissue]
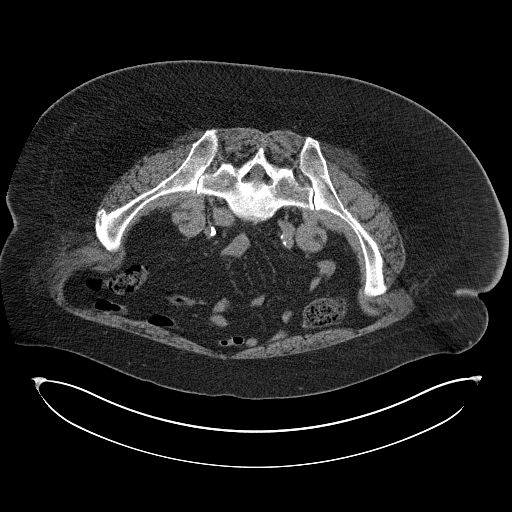
[im 16/26  soft-tissue]
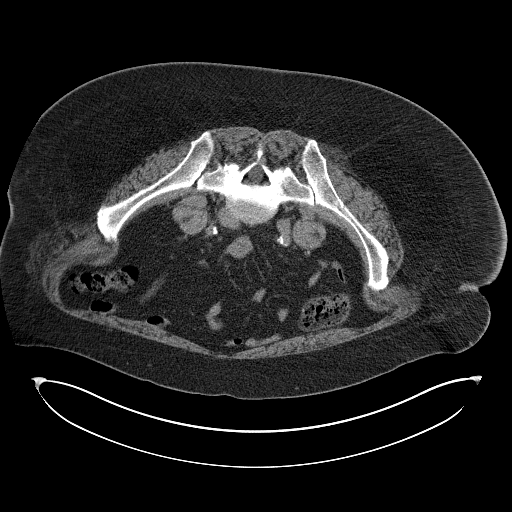
[im 17/26  soft-tissue]
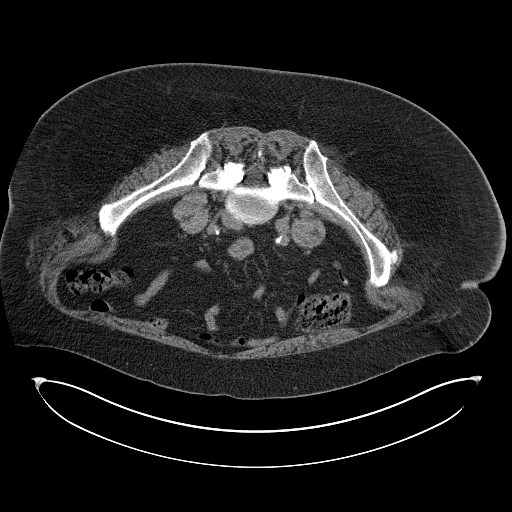
[im 17/26  bone]
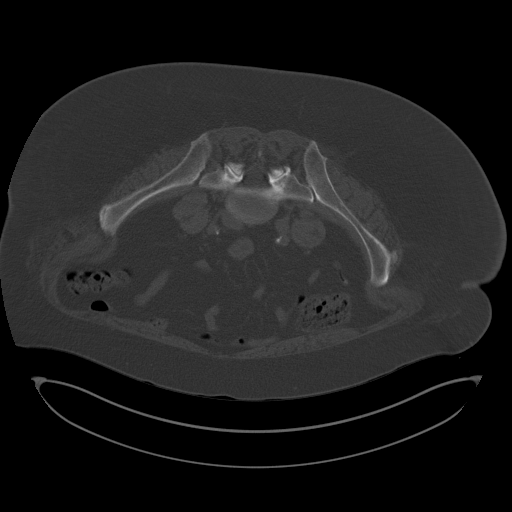
[im 19/26  soft-tissue]
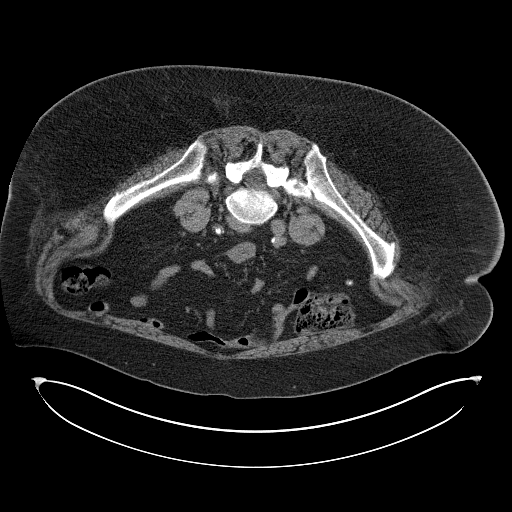
[im 19/26  lung]
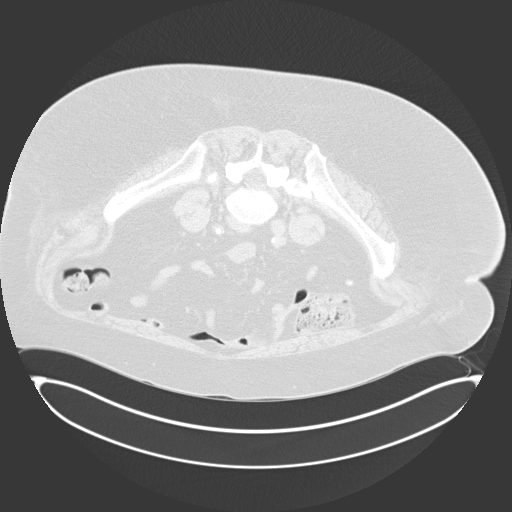
[im 21/26  soft-tissue]
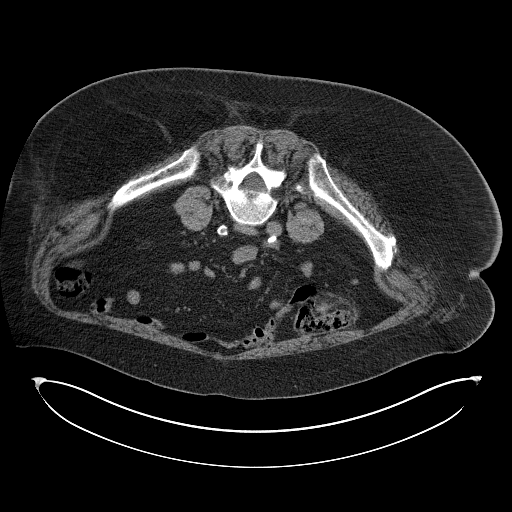
[im 21/26  lung]
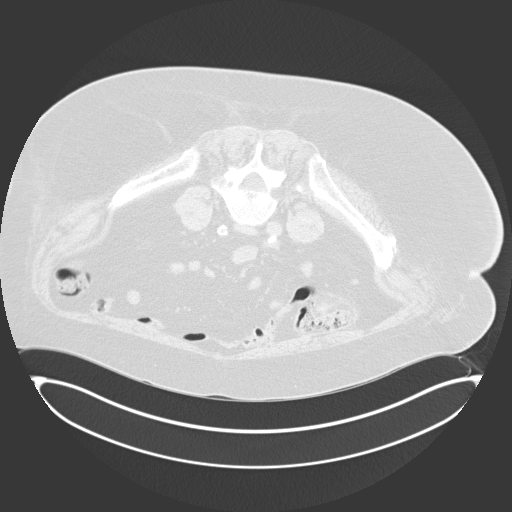
[im 22/26  soft-tissue]
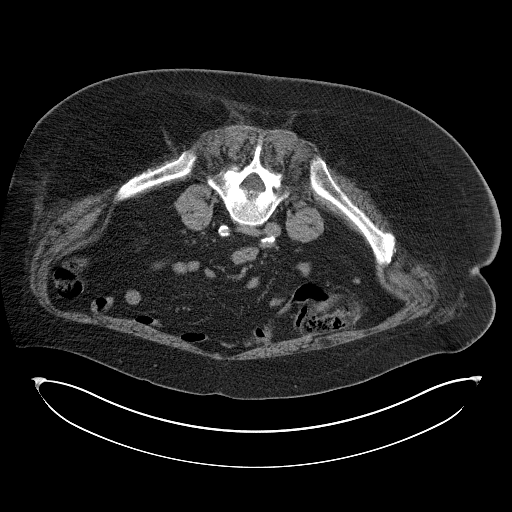
[im 22/26  lung]
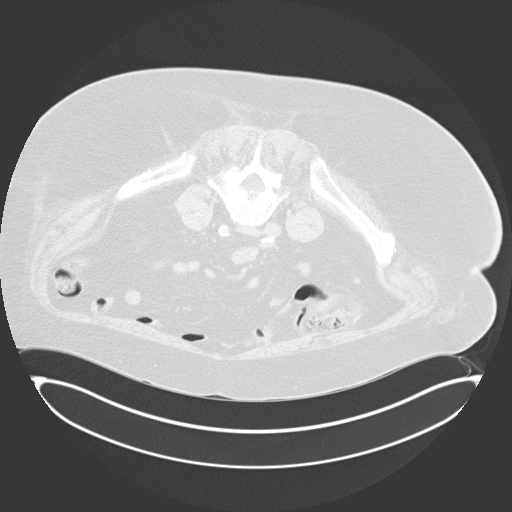
[im 24/26  soft-tissue]
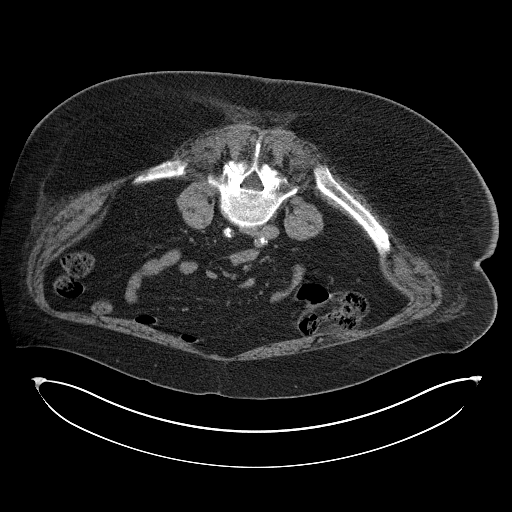
[im 24/26  lung]
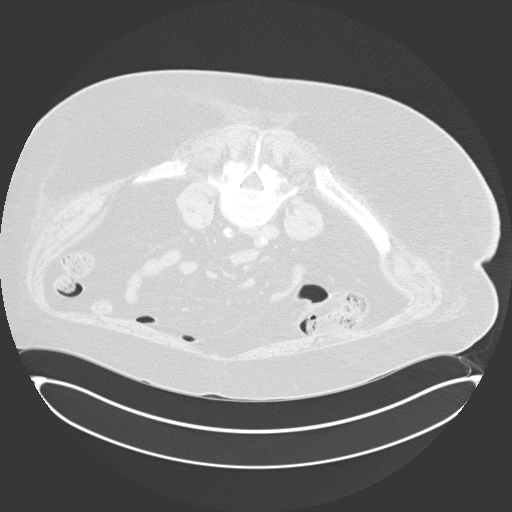

[13 of 32 positions shown; findings below may reference images not displayed]

EXAM:
CT-GUIDED BONE MARROW ASPIRATE AND CORE BIOPSY.

MEDICATIONS AND MEDICAL HISTORY:
Versed 1 mg, Fentanyl 50 mcg.

Additional Medications: None.

ANESTHESIA/SEDATION:
Moderate sedation time: 10 minutes

PROCEDURE:
The procedure, risks, benefits, and alternatives were explained to
the patient. Questions regarding the procedure were encouraged and
answered. The patient understands and consents to the procedure.

The back was prepped with Betadine in a sterile fashion, and a
sterile drape was applied covering the operative field. A sterile
gown and sterile gloves were used for the procedure.

Under CT guidance, an 11 gauge needle was inserted into the left
iliac bone, via posterior approach. Aspirates and a core were
obtained. Final imaging was performed.

Patient tolerated the procedure well without complication. Vital
sign monitoring by nursing staff during the procedure will continue
as patient is in the special procedures unit for post procedure
observation.
FINDINGS: The images document guide needle placement within the left iliac
bone. Post biopsy images demonstrate no hemorrhage.

COMPLICATIONS:
None
IMPRESSION: Successful CT-guided bone marrow aspirate and core from the left
iliac bone.

## 2016-03-11 IMAGING — DX DG CHEST 1V
2 series · 2 of 2 positions shown · non-contrast
Comparison: Chest radiograph performed 12/07/2014, and CT of the
chest performed 12/08/2014

CLINICAL DATA: Endotracheal tube placement and orogastric tube
placement. Status post seizure and cardiac arrest. Initial
encounter.

EXAM:
CHEST  1 VIEW

[chest ap (1 of 2)]
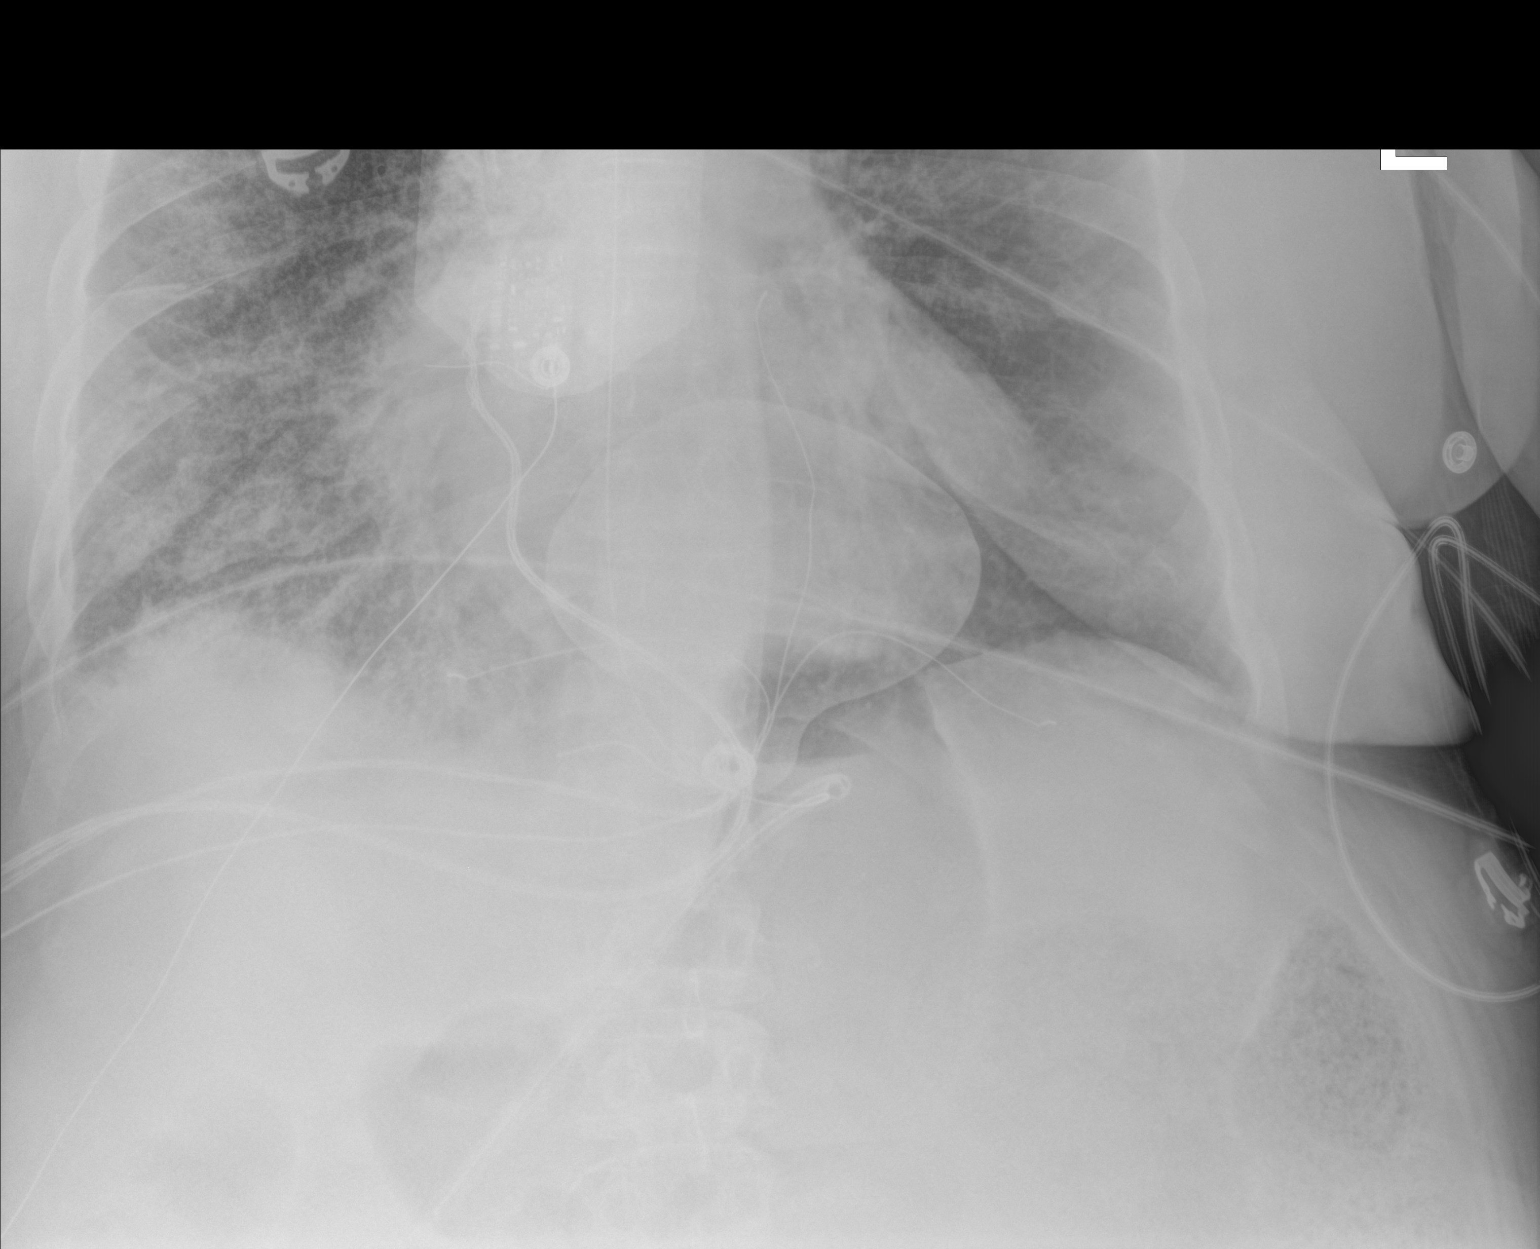

[chest ap (2 of 2)]
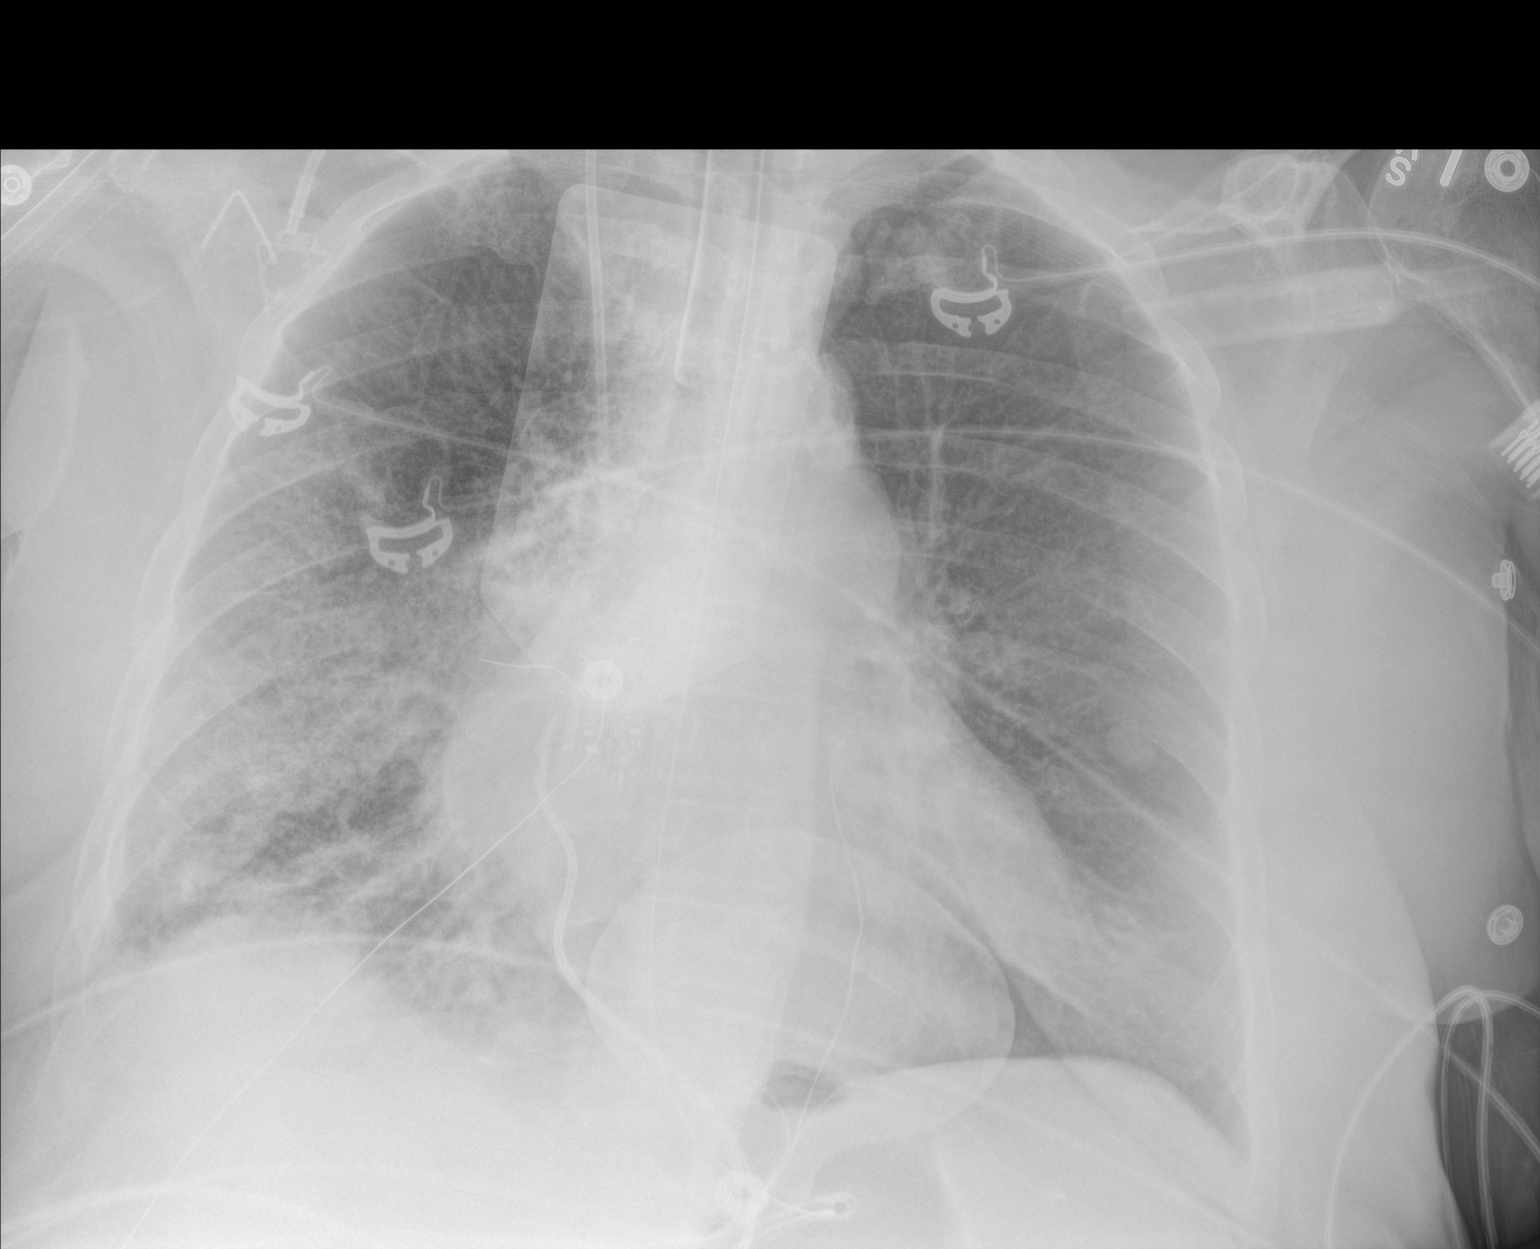

[2 of 2 positions shown; findings below may reference images not displayed]

FINDINGS: The patient's endotracheal tube is seen ending 3-4 cm above the
carina. A right-sided chest port is noted ending about the mid SVC.
An enteric tube is noted extending below the diaphragm.

The lungs are well-aerated. Patchy right-sided airspace
opacification is concerning for right-sided pneumonia. More mild
left-sided airspace opacities are seen. Asymmetric interstitial
edema is considered less likely. No definite pleural effusion or
pneumothorax is seen.

The patient's lingular nodule measures slightly larger than on prior
studies, but demonstrated no abnormal uptake on prior PET/CT
performed 09/21/2014. The right middle lobe pulmonary nodule is not
well characterized on radiograph.

The cardiomediastinal silhouette is borderline normal in size.
External pacing pads are noted. No acute osseous abnormalities are
seen. There is no evidence of focal opacification, pleural effusion
or pneumothorax.

The cardiomediastinal silhouette is within normal limits. No acute
osseous abnormalities are seen.
IMPRESSION: 1. Endotracheal tube seen ending 3-4 cm above the carina.
2. Patchy right-sided airspace opacification is concerning for
right-sided pneumonia. More mild left-sided airspace opacity seen.
Asymmetric interstitial edema is considered less likely.
